# Patient Record
Sex: Female | Born: 1990 | Race: Black or African American | Hispanic: No | Smoking: Former smoker
Health system: Southern US, Community
[De-identification: ages and names within clinical notes are randomized; demographics above are authoritative.]

## PROBLEM LIST (undated history)

## (undated) DIAGNOSIS — Z789 Other specified health status: Secondary | ICD-10-CM

## (undated) DIAGNOSIS — F419 Anxiety disorder, unspecified: Secondary | ICD-10-CM

## (undated) DIAGNOSIS — F29 Unspecified psychosis not due to a substance or known physiological condition: Secondary | ICD-10-CM

## (undated) HISTORY — PX: KNEE ARTHROSCOPY W/ ACL RECONSTRUCTION: SHX1858

---
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
## 2013-10-23 ENCOUNTER — Ambulatory Visit (INDEPENDENT_AMBULATORY_CARE_PROVIDER_SITE_OTHER): Payer: BC Managed Care – PPO | Admitting: Adult Health

## 2013-10-23 ENCOUNTER — Encounter: Payer: Self-pay | Admitting: Adult Health

## 2013-10-23 VITALS — BP 102/50 | Ht 73.0 in | Wt 237.2 lb

## 2013-10-23 DIAGNOSIS — Z3201 Encounter for pregnancy test, result positive: Secondary | ICD-10-CM

## 2013-10-23 DIAGNOSIS — Z3490 Encounter for supervision of normal pregnancy, unspecified, unspecified trimester: Secondary | ICD-10-CM

## 2013-10-23 LAB — POCT URINE PREGNANCY: Preg Test, Ur: POSITIVE

## 2013-10-23 MED ORDER — PRENATAL PLUS 27-1 MG PO TABS: 1.0000 | ORAL_TABLET | Freq: Every day | ORAL | Status: DC

## 2013-10-23 NOTE — Patient Instructions (Signed)
First Trimester of Pregnancy The first trimester of pregnancy is from week 1 until the end of week 12 (months 1 through 3). A week after a sperm fertilizes an egg, the egg will implant on the wall of the uterus. This embryo will begin to develop into a baby. Genes from you and your partner are forming the baby. The female genes determine whether the baby is a boy or a girl. At 6-8 weeks, the eyes and face are formed, and the heartbeat can be seen on ultrasound. At the end of 12 weeks, all the baby's organs are formed.  Now that you are pregnant, you will want to do everything you can to have a healthy baby. Two of the most important things are to get good prenatal care and to follow your health care provider's instructions. Prenatal care is all the medical care you receive before the baby's birth. This care will help prevent, find, and treat any problems during the pregnancy and childbirth. BODY CHANGES Your body goes through many changes during pregnancy. The changes vary from woman to woman.   You may gain or lose a couple of pounds at first.  You may feel sick to your stomach (nauseous) and throw up (vomit). If the vomiting is uncontrollable, call your health care provider.  You may tire easily.  You may develop headaches that can be relieved by medicines approved by your health care provider.  You may urinate more often. Painful urination may mean you have a bladder infection.  You may develop heartburn as a result of your pregnancy.  You may develop constipation because certain hormones are causing the muscles that push waste through your intestines to slow down.  You may develop hemorrhoids or swollen, bulging veins (varicose veins).  Your breasts may begin to grow larger and become tender. Your nipples may stick out more, and the tissue that surrounds them (areola) may become darker.  Your gums may bleed and may be sensitive to brushing and flossing.  Dark spots or blotches (chloasma,  mask of pregnancy) may develop on your face. This will likely fade after the baby is born.  Your menstrual periods will stop.  You may have a loss of appetite.  You may develop cravings for certain kinds of food.  You may have changes in your emotions from day to day, such as being excited to be pregnant or being concerned that something may go wrong with the pregnancy and baby.  You may have more vivid and strange dreams.  You may have changes in your hair. These can include thickening of your hair, rapid growth, and changes in texture. Some women also have hair loss during or after pregnancy, or hair that feels dry or thin. Your hair will most likely return to normal after your baby is born. WHAT TO EXPECT AT YOUR PRENATAL VISITS During a routine prenatal visit:  You will be weighed to make sure you and the baby are growing normally.  Your blood pressure will be taken.  Your abdomen will be measured to track your baby's growth.  The fetal heartbeat will be listened to starting around week 10 or 12 of your pregnancy.  Test results from any previous visits will be discussed. Your health care provider may ask you:  How you are feeling.  If you are feeling the baby move.  If you have had any abnormal symptoms, such as leaking fluid, bleeding, severe headaches, or abdominal cramping.  If you have any questions. Other tests   that may be performed during your first trimester include:  Blood tests to find your blood type and to check for the presence of any previous infections. They will also be used to check for low iron levels (anemia) and Rh antibodies. Later in the pregnancy, blood tests for diabetes will be done along with other tests if problems develop.  Urine tests to check for infections, diabetes, or protein in the urine.  An ultrasound to confirm the proper growth and development of the baby.  An amniocentesis to check for possible genetic problems.  Fetal screens for  spina bifida and Down syndrome.  You may need other tests to make sure you and the baby are doing well. HOME CARE INSTRUCTIONS  Medicines  Follow your health care provider's instructions regarding medicine use. Specific medicines may be either safe or unsafe to take during pregnancy.  Take your prenatal vitamins as directed.  If you develop constipation, try taking a stool softener if your health care provider approves. Diet  Eat regular, well-balanced meals. Choose a variety of foods, such as meat or vegetable-based protein, fish, milk and low-fat dairy products, vegetables, fruits, and whole grain breads and cereals. Your health care provider will help you determine the amount of weight gain that is right for you.  Avoid raw meat and uncooked cheese. These carry germs that can cause birth defects in the baby.  Eating four or five small meals rather than three large meals a day may help relieve nausea and vomiting. If you start to feel nauseous, eating a few soda crackers can be helpful. Drinking liquids between meals instead of during meals also seems to help nausea and vomiting.  If you develop constipation, eat more high-fiber foods, such as fresh vegetables or fruit and whole grains. Drink enough fluids to keep your urine clear or pale yellow. Activity and Exercise  Exercise only as directed by your health care provider. Exercising will help you:  Control your weight.  Stay in shape.  Be prepared for labor and delivery.  Experiencing pain or cramping in the lower abdomen or low back is a good sign that you should stop exercising. Check with your health care provider before continuing normal exercises.  Try to avoid standing for long periods of time. Move your legs often if you must stand in one place for a long time.  Avoid heavy lifting.  Wear low-heeled shoes, and practice good posture.  You may continue to have sex unless your health care provider directs you  otherwise. Relief of Pain or Discomfort  Wear a good support bra for breast tenderness.   Take warm sitz baths to soothe any pain or discomfort caused by hemorrhoids. Use hemorrhoid cream if your health care provider approves.   Rest with your legs elevated if you have leg cramps or low back pain.  If you develop varicose veins in your legs, wear support hose. Elevate your feet for 15 minutes, 3-4 times a day. Limit salt in your diet. Prenatal Care  Schedule your prenatal visits by the twelfth week of pregnancy. They are usually scheduled monthly at first, then more often in the last 2 months before delivery.  Write down your questions. Take them to your prenatal visits.  Keep all your prenatal visits as directed by your health care provider. Safety  Wear your seat belt at all times when driving.  Make a list of emergency phone numbers, including numbers for family, friends, the hospital, and police and fire departments. General Tips    Ask your health care provider for a referral to a local prenatal education class. Begin classes no later than at the beginning of month 6 of your pregnancy.  Ask for help if you have counseling or nutritional needs during pregnancy. Your health care provider can offer advice or refer you to specialists for help with various needs.  Do not use hot tubs, steam rooms, or saunas.  Do not douche or use tampons or scented sanitary pads.  Do not cross your legs for long periods of time.  Avoid cat litter boxes and soil used by cats. These carry germs that can cause birth defects in the baby and possibly loss of the fetus by miscarriage or stillbirth.  Avoid all smoking, herbs, alcohol, and medicines not prescribed by your health care provider. Chemicals in these affect the formation and growth of the baby.  Schedule a dentist appointment. At home, brush your teeth with a soft toothbrush and be gentle when you floss. SEEK MEDICAL CARE IF:   You have  dizziness.  You have mild pelvic cramps, pelvic pressure, or nagging pain in the abdominal area.  You have persistent nausea, vomiting, or diarrhea.  You have a bad smelling vaginal discharge.  You have pain with urination.  You notice increased swelling in your face, hands, legs, or ankles. SEEK IMMEDIATE MEDICAL CARE IF:   You have a fever.  You are leaking fluid from your vagina.  You have spotting or bleeding from your vagina.  You have severe abdominal cramping or pain.  You have rapid weight gain or loss.  You vomit blood or material that looks like coffee grounds.  You are exposed to German measles and have never had them.  You are exposed to fifth disease or chickenpox.  You develop a severe headache.  You have shortness of breath.  You have any kind of trauma, such as from a fall or a car accident. Document Released: 12/19/2000 Document Revised: 05/11/2013 Document Reviewed: 11/04/2012 [employer] Patient Information 2015 [employer]. This information is not intended to replace advice given to you by your health care provider. Make sure you discuss any questions you have with your health care provider. Return in 1 week for US 

## 2013-10-23 NOTE — Progress Notes (Signed)
Subjective:     Patient ID: Laurie Hooper, female   DOB: 12/25/1990, 23 y.o.   MRN: [id number]  HPI Laurie Hooper is a 23 year old black female in for UPT.  Review of Systems    see HPI Reviewed past medical,surgical, social and family history. Reviewed medications and allergies.  Objective:   Physical Exam BP 102/50  Ht 6\' 1"  (1.854 m)  Wt 237 lb 3.2 oz (107.593 kg)  BMI 31.30 kg/m2  LMP 08/13/2015UPT+ about 9+1 week EDD 05/28/14.Has some constipation increase fruit and water,No nausea.    Assessment:     Pregnant     Plan:     Rx prenatal plus #30 1 daily with 11 refills Follow up in 1 week for dating US Form for medicaid given application   Review handout on first tirmester

## 2013-10-30 ENCOUNTER — Encounter: Payer: Self-pay | Admitting: Adult Health

## 2013-10-30 ENCOUNTER — Other Ambulatory Visit: Payer: Self-pay | Admitting: Adult Health

## 2013-10-30 ENCOUNTER — Ambulatory Visit (INDEPENDENT_AMBULATORY_CARE_PROVIDER_SITE_OTHER): Payer: BC Managed Care – PPO

## 2013-10-30 DIAGNOSIS — O26841 Uterine size-date discrepancy, first trimester: Secondary | ICD-10-CM

## 2013-10-30 DIAGNOSIS — Z3490 Encounter for supervision of normal pregnancy, unspecified, unspecified trimester: Secondary | ICD-10-CM

## 2013-10-30 NOTE — Progress Notes (Signed)
U/S-single IUP with +FCA noted, FHR-179 bom, CRL c/w 8+4wks EDD 06/07/2014, cx appears closed, retroverted uterus, bilateral adnexa appears WNL

## 2013-11-04 ENCOUNTER — Encounter: Payer: Self-pay | Admitting: Advanced Practice Midwife

## 2013-11-04 ENCOUNTER — Ambulatory Visit (INDEPENDENT_AMBULATORY_CARE_PROVIDER_SITE_OTHER): Payer: BC Managed Care – PPO | Admitting: Advanced Practice Midwife

## 2013-11-04 ENCOUNTER — Other Ambulatory Visit (HOSPITAL_COMMUNITY)
Admission: RE | Admit: 2013-11-04 | Discharge: 2013-11-04 | Disposition: A | Discharge status: Home / Self Care | Payer: BC Managed Care – PPO | Source: Ambulatory Visit | Attending: Advanced Practice Midwife | Admitting: Advanced Practice Midwife

## 2013-11-04 VITALS — BP 108/64 | Wt 243.0 lb

## 2013-11-04 DIAGNOSIS — Z114 Encounter for screening for human immunodeficiency virus [HIV]: Secondary | ICD-10-CM

## 2013-11-04 DIAGNOSIS — Z3401 Encounter for supervision of normal first pregnancy, first trimester: Secondary | ICD-10-CM

## 2013-11-04 DIAGNOSIS — Z3490 Encounter for supervision of normal pregnancy, unspecified, unspecified trimester: Secondary | ICD-10-CM | POA: Insufficient documentation

## 2013-11-04 DIAGNOSIS — Z01411 Encounter for gynecological examination (general) (routine) with abnormal findings: Secondary | ICD-10-CM | POA: Insufficient documentation

## 2013-11-04 DIAGNOSIS — Z331 Pregnant state, incidental: Secondary | ICD-10-CM

## 2013-11-04 DIAGNOSIS — Z1371 Encounter for nonprocreative screening for genetic disease carrier status: Secondary | ICD-10-CM

## 2013-11-04 DIAGNOSIS — Z130 Encounter for screening for diseases of the blood and blood-forming organs and certain disorders involving the immune mechanism: Secondary | ICD-10-CM

## 2013-11-04 DIAGNOSIS — Z124 Encounter for screening for malignant neoplasm of cervix: Secondary | ICD-10-CM

## 2013-11-04 DIAGNOSIS — Z1389 Encounter for screening for other disorder: Secondary | ICD-10-CM

## 2013-11-04 DIAGNOSIS — Z3491 Encounter for supervision of normal pregnancy, unspecified, first trimester: Secondary | ICD-10-CM

## 2013-11-04 DIAGNOSIS — Z0184 Encounter for antibody response examination: Secondary | ICD-10-CM

## 2013-11-04 DIAGNOSIS — Z113 Encounter for screening for infections with a predominantly sexual mode of transmission: Secondary | ICD-10-CM | POA: Diagnosis present

## 2013-11-04 DIAGNOSIS — Z1151 Encounter for screening for human papillomavirus (HPV): Secondary | ICD-10-CM | POA: Diagnosis present

## 2013-11-04 DIAGNOSIS — R87810 Cervical high risk human papillomavirus (HPV) DNA test positive: Secondary | ICD-10-CM | POA: Insufficient documentation

## 2013-11-04 DIAGNOSIS — Z0283 Encounter for blood-alcohol and blood-drug test: Secondary | ICD-10-CM

## 2013-11-04 DIAGNOSIS — Z029 Encounter for administrative examinations, unspecified: Secondary | ICD-10-CM

## 2013-11-04 NOTE — Patient Instructions (Signed)
First Trimester of Pregnancy The first trimester of pregnancy is from week 1 until the end of week 12 (months 1 through 3). A week after a sperm fertilizes an egg, the egg will implant on the wall of the uterus. This embryo will begin to develop into a baby. Genes from you and your partner are forming the baby. The female genes determine whether the baby is a boy or a girl. At 6-8 weeks, the eyes and face are formed, and the heartbeat can be seen on ultrasound. At the end of 12 weeks, all the baby's organs are formed.  Now that you are pregnant, you will want to do everything you can to have a healthy baby. Two of the most important things are to get good prenatal care and to follow your health care provider's instructions. Prenatal care is all the medical care you receive before the baby's birth. This care will help prevent, find, and treat any problems during the pregnancy and childbirth. BODY CHANGES Your body goes through many changes during pregnancy. The changes vary from woman to woman.   You may gain or lose a couple of pounds at first.  You may feel sick to your stomach (nauseous) and throw up (vomit). If the vomiting is uncontrollable, call your health care provider.  You may tire easily.  You may develop headaches that can be relieved by medicines approved by your health care provider.  You may urinate more often. Painful urination may mean you have a bladder infection.  You may develop heartburn as a result of your pregnancy.  You may develop constipation because certain hormones are causing the muscles that push waste through your intestines to slow down.  You may develop hemorrhoids or swollen, bulging veins (varicose veins).  Your breasts may begin to grow larger and become tender. Your nipples may stick out more, and the tissue that surrounds them (areola) may become darker.  Your gums may bleed and may be sensitive to brushing and flossing.  Dark spots or blotches (chloasma,  mask of pregnancy) may develop on your face. This will likely fade after the baby is born.  Your menstrual periods will stop.  You may have a loss of appetite.  You may develop cravings for certain kinds of food.  You may have changes in your emotions from day to day, such as being excited to be pregnant or being concerned that something may go wrong with the pregnancy and baby.  You may have more vivid and strange dreams.  You may have changes in your hair. These can include thickening of your hair, rapid growth, and changes in texture. Some women also have hair loss during or after pregnancy, or hair that feels dry or thin. Your hair will most likely return to normal after your baby is born. WHAT TO EXPECT AT YOUR PRENATAL VISITS During a routine prenatal visit:  You will be weighed to make sure you and the baby are growing normally.  Your blood pressure will be taken.  Your abdomen will be measured to track your baby's growth.  The fetal heartbeat will be listened to starting around week 10 or 12 of your pregnancy.  Test results from any previous visits will be discussed. Your health care provider may ask you:  How you are feeling.  If you are feeling the baby move.  If you have had any abnormal symptoms, such as leaking fluid, bleeding, severe headaches, or abdominal cramping.  If you have any questions. Other tests   that may be performed during your first trimester include:  Blood tests to find your blood type and to check for the presence of any previous infections. They will also be used to check for low iron levels (anemia) and Rh antibodies. Later in the pregnancy, blood tests for diabetes will be done along with other tests if problems develop.  Urine tests to check for infections, diabetes, or protein in the urine.  An ultrasound to confirm the proper growth and development of the baby.  An amniocentesis to check for possible genetic problems.  Fetal screens for  spina bifida and Down syndrome.  You may need other tests to make sure you and the baby are doing well. HOME CARE INSTRUCTIONS  Medicines  Follow your health care provider's instructions regarding medicine use. Specific medicines may be either safe or unsafe to take during pregnancy.  Take your prenatal vitamins as directed.  If you develop constipation, try taking a stool softener if your health care provider approves. Diet  Eat regular, well-balanced meals. Choose a variety of foods, such as meat or vegetable-based protein, fish, milk and low-fat dairy products, vegetables, fruits, and whole grain breads and cereals. Your health care provider will help you determine the amount of weight gain that is right for you.  Avoid raw meat and uncooked cheese. These carry germs that can cause birth defects in the baby.  Eating four or five small meals rather than three large meals a day may help relieve nausea and vomiting. If you start to feel nauseous, eating a few soda crackers can be helpful. Drinking liquids between meals instead of during meals also seems to help nausea and vomiting.  If you develop constipation, eat more high-fiber foods, such as fresh vegetables or fruit and whole grains. Drink enough fluids to keep your urine clear or pale yellow. Activity and Exercise  Exercise only as directed by your health care provider. Exercising will help you:  Control your weight.  Stay in shape.  Be prepared for labor and delivery.  Experiencing pain or cramping in the lower abdomen or low back is a good sign that you should stop exercising. Check with your health care provider before continuing normal exercises.  Try to avoid standing for long periods of time. Move your legs often if you must stand in one place for a long time.  Avoid heavy lifting.  Wear low-heeled shoes, and practice good posture.  You may continue to have sex unless your health care provider directs you  otherwise. Relief of Pain or Discomfort  Wear a good support bra for breast tenderness.   Take warm sitz baths to soothe any pain or discomfort caused by hemorrhoids. Use hemorrhoid cream if your health care provider approves.   Rest with your legs elevated if you have leg cramps or low back pain.  If you develop varicose veins in your legs, wear support hose. Elevate your feet for 15 minutes, 3-4 times a day. Limit salt in your diet. Prenatal Care  Schedule your prenatal visits by the twelfth week of pregnancy. They are usually scheduled monthly at first, then more often in the last 2 months before delivery.  Write down your questions. Take them to your prenatal visits.  Keep all your prenatal visits as directed by your health care provider. Safety  Wear your seat belt at all times when driving.  Make a list of emergency phone numbers, including numbers for family, friends, the hospital, and police and fire departments. General Tips    Ask your health care provider for a referral to a local prenatal education class. Begin classes no later than at the beginning of month 6 of your pregnancy.  Ask for help if you have counseling or nutritional needs during pregnancy. Your health care provider can offer advice or refer you to specialists for help with various needs.  Do not use hot tubs, steam rooms, or saunas.  Do not douche or use tampons or scented sanitary pads.  Do not cross your legs for long periods of time.  Avoid cat litter boxes and soil used by cats. These carry germs that can cause birth defects in the baby and possibly loss of the fetus by miscarriage or stillbirth.  Avoid all smoking, herbs, alcohol, and medicines not prescribed by your health care provider. Chemicals in these affect the formation and growth of the baby.  Schedule a dentist appointment. At home, brush your teeth with a soft toothbrush and be gentle when you floss. SEEK MEDICAL CARE IF:   You have  dizziness.  You have mild pelvic cramps, pelvic pressure, or nagging pain in the abdominal area.  You have persistent nausea, vomiting, or diarrhea.  You have a bad smelling vaginal discharge.  You have pain with urination.  You notice increased swelling in your face, hands, legs, or ankles. SEEK IMMEDIATE MEDICAL CARE IF:   You have a fever.  You are leaking fluid from your vagina.  You have spotting or bleeding from your vagina.  You have severe abdominal cramping or pain.  You have rapid weight gain or loss.  You vomit blood or material that looks like coffee grounds.  You are exposed to German measles and have never had them.  You are exposed to fifth disease or chickenpox.  You develop a severe headache.  You have shortness of breath.  You have any kind of trauma, such as from a fall or a car accident. Document Released: 12/19/2000 Document Revised: 05/11/2013 Document Reviewed: 11/04/2012 [employer] Patient Information 2015 [employer]. This information is not intended to replace advice given to you by your health care provider. Make sure you discuss any questions you have with your health care provider.  

## 2013-11-04 NOTE — Progress Notes (Signed)
  Subjective:    Laurie Hooper is a G1P0 6881w2d being seen today for her first obstetrical visit.  Her obstetrical history is significant for nothing..  Pregnancy history fully reviewed.  Patient reports fatigue.  There were no vitals filed for this visit.  HISTORY: OB History  Gravida Para Term Preterm AB SAB TAB Ectopic Multiple Living  1             # Outcome Date GA Lbr Len/2nd Weight Sex Delivery Anes PTL Lv  1 CUR              No past medical history on file. Past Surgical History  Procedure Laterality Date  . Acl restoration     Family History  Problem Relation Age of Onset  . Diabetes Paternal Grandmother   . Kidney disease Father   . Thyroid disease Mother      Exam       Pelvic Exam:    Perineum: Normal Perineum   Vulva: normal   Vagina:  normal mucosa, normal discharge, no palpable nodules   Uterus Normal, Gravid, FH: ~9     Cervix: normal   Adnexa: Not palpable   Urinary:  urethral meatus normal    System: Breast:  normal appearance, no masses or tenderness   Skin: normal coloration and turgor, no rashes    Neurologic: oriented, normal, normal mood   Extremities: normal strength, tone, and muscle mass   HEENT PERRLA   Mouth/Teeth mucous membranes moist, normal dentition   Neck supple and no masses   Cardiovascular: regular rate and rhythm   Respiratory:  appears well, vitals normal, no respiratory distress, acyanotic   Abdomen: soft, non-tender;  FHR: 160 us          Assessment:    Pregnancy: G1P0 Patient Active Problem List   Diagnosis Date Noted  . Supervision of normal pregnancy 11/04/2013  . Pregnant 10/23/2013        Plan:     Initial labs drawn. Continue prenatal vitamins  Problem list reviewed and updated  Reviewed n/v relief measures and warning s/s to report  Reviewed recommended weight gain based on pre-gravid BMI  Encouraged well-balanced diet Genetic Screening discussed Integrated Screen: requested.  Ultrasound discussed; fetal survey: requested.  Follow up in 3 weeks for NT/IT.  CRESENZO-DISHMAN,Karolynn Hooper 11/04/2013

## 2013-11-05 LAB — HIV ANTIBODY (ROUTINE TESTING W REFLEX): HIV: NONREACTIVE

## 2013-11-05 LAB — URINALYSIS, ROUTINE W REFLEX MICROSCOPIC
BILIRUBIN URINE: NEGATIVE
Glucose, UA: NEGATIVE mg/dL
Hgb urine dipstick: NEGATIVE
Ketones, ur: NEGATIVE mg/dL
NITRITE: POSITIVE — AB
PROTEIN: NEGATIVE mg/dL
Specific Gravity, Urine: >1.03 — ABNORMAL HIGH (ref 1.005–1.030)
UROBILINOGEN UA: 0.2 mg/dL (ref 0.0–1.0)
pH: 6 (ref 5.0–8.0)

## 2013-11-05 LAB — CBC
HCT: 37.7 % (ref 36.0–46.0)
Hemoglobin: 12.7 g/dL (ref 12.0–15.0)
MCH: 28.3 pg (ref 26.0–34.0)
MCHC: 33.7 g/dL (ref 30.0–36.0)
MCV: 84.2 fL (ref 78.0–100.0)
PLATELETS: 224 10*3/uL (ref 150–400)
RBC: 4.48 MIL/uL (ref 3.87–5.11)
RDW: 13.4 % (ref 11.5–15.5)
WBC: 9.4 10*3/uL (ref 4.0–10.5)

## 2013-11-05 LAB — URINALYSIS, MICROSCOPIC ONLY
Bacteria, UA: NONE SEEN
CASTS: NONE SEEN
Crystals: NONE SEEN
Squamous Epithelial / LPF: NONE SEEN

## 2013-11-05 LAB — ABO AND RH: RH TYPE: POSITIVE

## 2013-11-05 LAB — DRUG SCREEN, URINE, NO CONFIRMATION
Amphetamine Screen, Ur: NEGATIVE
Barbiturate Quant, Ur: NEGATIVE
Benzodiazepines.: NEGATIVE
CREATININE, U: 289.7 mg/dL
Cocaine Metabolites: NEGATIVE
Marijuana Metabolite: NEGATIVE
Methadone: NEGATIVE
Opiate Screen, Urine: NEGATIVE
Phencyclidine (PCP): NEGATIVE
Propoxyphene: NEGATIVE

## 2013-11-05 LAB — OXYCODONE SCREEN, UA, RFLX CONFIRM: Oxycodone Screen, Ur: NEGATIVE ng/mL

## 2013-11-05 LAB — VARICELLA ZOSTER ANTIBODY, IGG: VARICELLA IGG: 183.2 {index} — AB (ref <135.00)

## 2013-11-05 LAB — CYSTIC FIBROSIS DIAGNOSTIC STUDY

## 2013-11-05 LAB — SICKLE CELL SCREEN: Sickle Cell Screen: NEGATIVE

## 2013-11-05 LAB — RUBELLA SCREEN: Rubella: 1.42 Index — ABNORMAL HIGH (ref <0.90)

## 2013-11-05 LAB — RPR

## 2013-11-05 LAB — HEPATITIS B SURFACE ANTIGEN: Hepatitis B Surface Ag: NEGATIVE

## 2013-11-05 LAB — ANTIBODY SCREEN: ANTIBODY SCREEN: NEGATIVE

## 2013-11-05 MED ORDER — NITROFURANTOIN MONOHYD MACRO 100 MG PO CAPS: 100.0000 mg | ORAL_CAPSULE | Freq: Two times a day (BID) | ORAL | Status: AC

## 2013-11-05 NOTE — Addendum Note (Signed)
Addended by: Jacklyn ShellRESENZO-DISHMON, Delano Scardino on: 11/05/2013 01:33 PM   Modules accepted: Orders

## 2013-11-05 NOTE — Progress Notes (Signed)
U/A + Nitrites.  Rx macrobid 100mg  BID

## 2013-11-06 LAB — URINE CULTURE
Colony Count: NO GROWTH
Organism ID, Bacteria: NO GROWTH

## 2013-11-06 LAB — CYTOLOGY - PAP

## 2013-11-09 ENCOUNTER — Encounter: Payer: Self-pay | Admitting: Advanced Practice Midwife

## 2013-11-15 ENCOUNTER — Encounter: Payer: Self-pay | Admitting: Women's Health

## 2013-11-25 ENCOUNTER — Ambulatory Visit (INDEPENDENT_AMBULATORY_CARE_PROVIDER_SITE_OTHER): Payer: BC Managed Care – PPO

## 2013-11-25 ENCOUNTER — Ambulatory Visit (INDEPENDENT_AMBULATORY_CARE_PROVIDER_SITE_OTHER): Payer: BC Managed Care – PPO | Admitting: Women's Health

## 2013-11-25 ENCOUNTER — Encounter: Payer: Self-pay | Admitting: Women's Health

## 2013-11-25 ENCOUNTER — Other Ambulatory Visit: Payer: Self-pay | Admitting: Advanced Practice Midwife

## 2013-11-25 VITALS — BP 116/68 | Wt 241.0 lb

## 2013-11-25 DIAGNOSIS — Z3491 Encounter for supervision of normal pregnancy, unspecified, first trimester: Secondary | ICD-10-CM

## 2013-11-25 DIAGNOSIS — Z3401 Encounter for supervision of normal first pregnancy, first trimester: Secondary | ICD-10-CM

## 2013-11-25 DIAGNOSIS — Z331 Pregnant state, incidental: Secondary | ICD-10-CM

## 2013-11-25 DIAGNOSIS — O98811 Other maternal infectious and parasitic diseases complicating pregnancy, first trimester: Secondary | ICD-10-CM

## 2013-11-25 DIAGNOSIS — Z1389 Encounter for screening for other disorder: Secondary | ICD-10-CM

## 2013-11-25 DIAGNOSIS — Z3682 Encounter for antenatal screening for nuchal translucency: Secondary | ICD-10-CM

## 2013-11-25 DIAGNOSIS — A749 Chlamydial infection, unspecified: Secondary | ICD-10-CM

## 2013-11-25 DIAGNOSIS — Z36 Encounter for antenatal screening of mother: Secondary | ICD-10-CM

## 2013-11-25 LAB — POCT URINALYSIS DIPSTICK
Glucose, UA: NEGATIVE
Ketones, UA: NEGATIVE
Leukocytes, UA: NEGATIVE
NITRITE UA: NEGATIVE
RBC UA: NEGATIVE

## 2013-11-25 MED ORDER — AZITHROMYCIN 500 MG PO TABS: 1000.0000 mg | ORAL_TABLET | Freq: Once | ORAL | Status: DC

## 2013-11-25 NOTE — Progress Notes (Signed)
Low-risk OB appointment G1P0 657w2d Estimated Date of Delivery: 06/07/14 BP 116/68 mmHg  Wt 241 lb (109.317 kg)  LMP 08/20/2013  BP, weight, and urine reviewed.  Refer to obstetrical flow sheet for FH & FHR.  No fm yet. Denies cramping, lof, vb, or uti s/s. No complaints. Notified of +CT on pap, rx azithromycin 1gm po x 1 for pt & partner Barkley BoardsJohncie Hooper DOB: 03/06/[birth year], NKDA- called his into [facility]. No sex x at least 7d after both treated.  Reviewed today's normal NT u/s, warning s/s to report. Plan:  Continue routine obstetrical care  F/U in 4wks for OB appointment and 2nd IT 1st NT/IT today

## 2013-11-25 NOTE — Progress Notes (Signed)
U/S(12+2wks)-single active fetus, CRL c/w dates, cx appears closed, bilateral adnexa appears WNL, NB present, NT-1.637mm, FHR-161 bpm, anterior Gr 0 placenta with multiple placental lakes noted within

## 2013-11-25 NOTE — Patient Instructions (Signed)
First Trimester of Pregnancy The first trimester of pregnancy is from week 1 until the end of week 12 (months 1 through 3). A week after a sperm fertilizes an egg, the egg will implant on the wall of the uterus. This embryo will begin to develop into a baby. Genes from you and your partner are forming the baby. The female genes determine whether the baby is a boy or a girl. At 6-8 weeks, the eyes and face are formed, and the heartbeat can be seen on ultrasound. At the end of 12 weeks, all the baby's organs are formed.  Now that you are pregnant, you will want to do everything you can to have a healthy baby. Two of the most important things are to get good prenatal care and to follow your health care provider's instructions. Prenatal care is all the medical care you receive before the baby's birth. This care will help prevent, find, and treat any problems during the pregnancy and childbirth. BODY CHANGES Your body goes through many changes during pregnancy. The changes vary from woman to woman.   You may gain or lose a couple of pounds at first.  You may feel sick to your stomach (nauseous) and throw up (vomit). If the vomiting is uncontrollable, call your health care provider.  You may tire easily.  You may develop headaches that can be relieved by medicines approved by your health care provider.  You may urinate more often. Painful urination may mean you have a bladder infection.  You may develop heartburn as a result of your pregnancy.  You may develop constipation because certain hormones are causing the muscles that push waste through your intestines to slow down.  You may develop hemorrhoids or swollen, bulging veins (varicose veins).  Your breasts may begin to grow larger and become tender. Your nipples may stick out more, and the tissue that surrounds them (areola) may become darker.  Your gums may bleed and may be sensitive to brushing and flossing.  Dark spots or blotches (chloasma,  mask of pregnancy) may develop on your face. This will likely fade after the baby is born.  Your menstrual periods will stop.  You may have a loss of appetite.  You may develop cravings for certain kinds of food.  You may have changes in your emotions from day to day, such as being excited to be pregnant or being concerned that something may go wrong with the pregnancy and baby.  You may have more vivid and strange dreams.  You may have changes in your hair. These can include thickening of your hair, rapid growth, and changes in texture. Some women also have hair loss during or after pregnancy, or hair that feels dry or thin. Your hair will most likely return to normal after your baby is born. WHAT TO EXPECT AT YOUR PRENATAL VISITS During a routine prenatal visit:  You will be weighed to make sure you and the baby are growing normally.  Your blood pressure will be taken.  Your abdomen will be measured to track your baby's growth.  The fetal heartbeat will be listened to starting around week 10 or 12 of your pregnancy.  Test results from any previous visits will be discussed. Your health care provider may ask you:  How you are feeling.  If you are feeling the baby move.  If you have had any abnormal symptoms, such as leaking fluid, bleeding, severe headaches, or abdominal cramping.  If you have any questions. Other tests   that may be performed during your first trimester include:  Blood tests to find your blood type and to check for the presence of any previous infections. They will also be used to check for low iron levels (anemia) and Rh antibodies. Later in the pregnancy, blood tests for diabetes will be done along with other tests if problems develop.  Urine tests to check for infections, diabetes, or protein in the urine.  An ultrasound to confirm the proper growth and development of the baby.  An amniocentesis to check for possible genetic problems.  Fetal screens for  spina bifida and Down syndrome.  You may need other tests to make sure you and the baby are doing well. HOME CARE INSTRUCTIONS  Medicines  Follow your health care provider's instructions regarding medicine use. Specific medicines may be either safe or unsafe to take during pregnancy.  Take your prenatal vitamins as directed.  If you develop constipation, try taking a stool softener if your health care provider approves. Diet  Eat regular, well-balanced meals. Choose a variety of foods, such as meat or vegetable-based protein, fish, milk and low-fat dairy products, vegetables, fruits, and whole grain breads and cereals. Your health care provider will help you determine the amount of weight gain that is right for you.  Avoid raw meat and uncooked cheese. These carry germs that can cause birth defects in the baby.  Eating four or five small meals rather than three large meals a day may help relieve nausea and vomiting. If you start to feel nauseous, eating a few soda crackers can be helpful. Drinking liquids between meals instead of during meals also seems to help nausea and vomiting.  If you develop constipation, eat more high-fiber foods, such as fresh vegetables or fruit and whole grains. Drink enough fluids to keep your urine clear or pale yellow. Activity and Exercise  Exercise only as directed by your health care provider. Exercising will help you:  Control your weight.  Stay in shape.  Be prepared for labor and delivery.  Experiencing pain or cramping in the lower abdomen or low back is a good sign that you should stop exercising. Check with your health care provider before continuing normal exercises.  Try to avoid standing for long periods of time. Move your legs often if you must stand in one place for a long time.  Avoid heavy lifting.  Wear low-heeled shoes, and practice good posture.  You may continue to have sex unless your health care provider directs you  otherwise. Relief of Pain or Discomfort  Wear a good support bra for breast tenderness.   Take warm sitz baths to soothe any pain or discomfort caused by hemorrhoids. Use hemorrhoid cream if your health care provider approves.   Rest with your legs elevated if you have leg cramps or low back pain.  If you develop varicose veins in your legs, wear support hose. Elevate your feet for 15 minutes, 3-4 times a day. Limit salt in your diet. Prenatal Care  Schedule your prenatal visits by the twelfth week of pregnancy. They are usually scheduled monthly at first, then more often in the last 2 months before delivery.  Write down your questions. Take them to your prenatal visits.  Keep all your prenatal visits as directed by your health care provider. Safety  Wear your seat belt at all times when driving.  Make a list of emergency phone numbers, including numbers for family, friends, the hospital, and police and fire departments. General Tips    Ask your health care provider for a referral to a local prenatal education class. Begin classes no later than at the beginning of month 6 of your pregnancy.  Ask for help if you have counseling or nutritional needs during pregnancy. Your health care provider can offer advice or refer you to specialists for help with various needs.  Do not use hot tubs, steam rooms, or saunas.  Do not douche or use tampons or scented sanitary pads.  Do not cross your legs for long periods of time.  Avoid cat litter boxes and soil used by cats. These carry germs that can cause birth defects in the baby and possibly loss of the fetus by miscarriage or stillbirth.  Avoid all smoking, herbs, alcohol, and medicines not prescribed by your health care provider. Chemicals in these affect the formation and growth of the baby.  Schedule a dentist appointment. At home, brush your teeth with a soft toothbrush and be gentle when you floss. SEEK MEDICAL CARE IF:   You have  dizziness.  You have mild pelvic cramps, pelvic pressure, or nagging pain in the abdominal area.  You have persistent nausea, vomiting, or diarrhea.  You have a bad smelling vaginal discharge.  You have pain with urination.  You notice increased swelling in your face, hands, legs, or ankles. SEEK IMMEDIATE MEDICAL CARE IF:   You have a fever.  You are leaking fluid from your vagina.  You have spotting or bleeding from your vagina.  You have severe abdominal cramping or pain.  You have rapid weight gain or loss.  You vomit blood or material that looks like coffee grounds.  You are exposed to [place] and have never had them.  You are exposed to fifth disease or chickenpox.  You develop a severe headache.  You have shortness of breath.  You have any kind of trauma, such as from a fall or a car accident. Document Released: 12/19/2000 Document Revised: 05/11/2013 Document Reviewed: 11/04/2012 [facility] Patient Information 2015 [place], [place]. This information is not intended to replace advice given to you by your health care provider. Make sure you discuss any questions you have with your health care provider.  Chlamydia Chlamydia is an infection. It is spread through sexual contact. Chlamydia can be in different areas of the body. These areas include the cervix, urethra, throat, or rectum. You may not know you have chlamydia because many people never develop the symptoms. Chlamydia is not difficult to treat once you know you have it. However, if it is left untreated, chlamydia can lead to more serious health problems.  CAUSES  Chlamydia is caused by bacteria. It is a sexually transmitted disease. It is passed from an infected partner during intimate contact. This contact could be with the genitals, mouth, or rectal area. Chlamydia can also be passed from mothers to babies during birth. SIGNS AND SYMPTOMS  There may not be any symptoms. This is often the case early in the  infection. If symptoms develop, they may include:  Mild pain and discomfort when urinating.  Redness, soreness, and swelling (inflammation) of the rectum.  Vaginal discharge.  Painful intercourse.  Abdominal pain.  Bleeding between menstrual periods. DIAGNOSIS  To diagnose this infection, your health care provider will do a pelvic exam. Cultures will be taken of the vagina, cervix, urine, and possibly the rectum to verify the diagnosis.  TREATMENT You will be given antibiotic medicines. If you are pregnant, certain types of antibiotics will need to be avoided. Any  sexual partners should also be treated, even if they do not show symptoms.  HOME CARE INSTRUCTIONS   Take your antibiotic medicine as directed by your health care provider. Finish the antibiotic even if you start to feel better.  Take medicines only as directed by your health care provider.  Inform any sexual partners about the infection. They should also be treated.  Do not have sexual contact until your health care provider tells you it is okay.  Get plenty of rest.  Eat a well-balanced diet.  Drink enough fluids to keep your urine clear or pale yellow.  Keep all follow-up visits as directed by your health care provider. SEEK MEDICAL CARE IF:  You have painful urination.  You have abdominal pain.  You have vaginal discharge.  You have painful sexual intercourse.  You have bleeding between periods and after sex.  You have a fever. SEEK IMMEDIATE MEDICAL CARE IF:   You experience nausea or vomiting.  You experience excessive sweating (diaphoresis).  You have difficulty swallowing. MAKE SURE YOU:   Understand these instructions.  Will watch your condition.  Will get help right away if you are not doing well or get worse. Document Released: 10/04/2004 Document Revised: 05/11/2013 Document Reviewed: 09/01/2012 [facility] Patient Information 2015 [place], [place]. This information is not intended to  replace advice given to you by your health care provider. Make sure you discuss any questions you have with your health care provider.

## 2013-12-01 LAB — MATERNAL SCREEN, INTEGRATED #1

## 2013-12-08 ENCOUNTER — Telehealth: Payer: Self-pay | Admitting: Obstetrics & Gynecology

## 2013-12-08 NOTE — Telephone Encounter (Signed)
Pt states needs additional forms for FMLA to be completed. Pt informed to bring forms to office with pt name, phone number, dates missed due to sick and any other pertinent information and will complete and fax forms as pt requested. Pt verbalized understanding.

## 2013-12-23 ENCOUNTER — Ambulatory Visit (INDEPENDENT_AMBULATORY_CARE_PROVIDER_SITE_OTHER): Payer: BC Managed Care – PPO | Admitting: Advanced Practice Midwife

## 2013-12-23 ENCOUNTER — Encounter: Payer: Self-pay | Admitting: Advanced Practice Midwife

## 2013-12-23 VITALS — BP 104/58 | Wt 245.0 lb

## 2013-12-23 DIAGNOSIS — O09292 Supervision of pregnancy with other poor reproductive or obstetric history, second trimester: Secondary | ICD-10-CM

## 2013-12-23 DIAGNOSIS — Z1389 Encounter for screening for other disorder: Secondary | ICD-10-CM

## 2013-12-23 DIAGNOSIS — Z331 Pregnant state, incidental: Secondary | ICD-10-CM

## 2013-12-23 DIAGNOSIS — Z363 Encounter for antenatal screening for malformations: Secondary | ICD-10-CM

## 2013-12-23 DIAGNOSIS — Z3682 Encounter for antenatal screening for nuchal translucency: Secondary | ICD-10-CM

## 2013-12-23 DIAGNOSIS — O09892 Supervision of other high risk pregnancies, second trimester: Secondary | ICD-10-CM

## 2013-12-23 LAB — POCT URINALYSIS DIPSTICK
Blood, UA: NEGATIVE
Blood, UA: NEGATIVE
GLUCOSE UA: NEGATIVE
Glucose, UA: NEGATIVE
Ketones, UA: NEGATIVE
Ketones, UA: NEGATIVE
LEUKOCYTES UA: NEGATIVE
Leukocytes, UA: NEGATIVE
NITRITE UA: NEGATIVE
Nitrite, UA: NEGATIVE
PROTEIN UA: NEGATIVE
Protein, UA: NEGATIVE

## 2013-12-23 NOTE — Progress Notes (Signed)
G1P0 5680w2d Estimated Date of Delivery: 06/07/14  Blood pressure 104/58, weight 245 lb (111.131 kg), last menstrual period 08/20/2013.   BP weight and urine results all reviewed and noted.  Please refer to the obstetrical flow sheet for the fundal height and fetal heart rate documentation:  Patient denies any bleeding and no rupture of membranes symptoms or regular contractions. Patient is without complaints. All questions were answered.  Plan:  Continued routine obstetrical care, POC CHL today  Follow up in 3 weeks for OB appointment, anatomy scan

## 2013-12-24 LAB — GC/CHLAMYDIA PROBE AMP
CT Probe RNA: NEGATIVE
GC Probe RNA: NEGATIVE

## 2013-12-31 LAB — MATERNAL SCREEN, INTEGRATED #2
AFP MoM: 0.98
AFP, Serum: 26.7 ng/mL
Age risk Down Syndrome: 1:1100 {titer}
Calculated Gestational Age: 15.9
Crown Rump Length: 54.5 mm
Estriol Mom: 1.64
Estriol, Free: 1.09 ng/mL
INHIBIN A DIMERIC MAT SCREEN: 178 pg/mL
Inhibin A MoM: 1.27
MSS Down Syndrome: <1:5000 {titer}
NT MOM MAT SCREEN: 1.31
Nuchal Translucency: 1.7 mm
Number of fetuses: 1
PAPP-A MAT SCREEN: 426 ng/mL
PAPP-A MoM: 0.96
hCG MoM: 1.13
hCG, Serum: 38.3 IU/mL

## 2014-01-08 NOTE — L&D Delivery Note (Cosign Needed)
Delivery Note At 12:33 AM a viable female was delivered via  (Presentation:vertex ; loa ).  APGAR:8 ,9 ; weight  .   Placenta status:spont ,via shultz .  Cord:  with the following complications: none.  Cord pH: n/a  Anesthesia: Epidural  Episiotomy:  none Lacerations:  Right labial Suture Repair: 3.0 vicryl Est. Blood Loss150 (mL):    Mom to postpartum.  Baby to Couplet care / Skin to Skin.  Laurie Hooper, Laurie Hooper 06/14/2014, 12:53 AM

## 2014-01-14 ENCOUNTER — Ambulatory Visit (INDEPENDENT_AMBULATORY_CARE_PROVIDER_SITE_OTHER): Payer: BLUE CROSS/BLUE SHIELD | Admitting: Advanced Practice Midwife

## 2014-01-14 ENCOUNTER — Other Ambulatory Visit: Payer: Self-pay | Admitting: Advanced Practice Midwife

## 2014-01-14 ENCOUNTER — Ambulatory Visit (INDEPENDENT_AMBULATORY_CARE_PROVIDER_SITE_OTHER): Payer: BLUE CROSS/BLUE SHIELD

## 2014-01-14 VITALS — BP 112/72 | Wt 249.0 lb

## 2014-01-14 DIAGNOSIS — Z3402 Encounter for supervision of normal first pregnancy, second trimester: Secondary | ICD-10-CM

## 2014-01-14 DIAGNOSIS — Z331 Pregnant state, incidental: Secondary | ICD-10-CM

## 2014-01-14 DIAGNOSIS — Z36 Encounter for antenatal screening of mother: Secondary | ICD-10-CM

## 2014-01-14 DIAGNOSIS — Z363 Encounter for antenatal screening for malformations: Secondary | ICD-10-CM

## 2014-01-14 DIAGNOSIS — O468X2 Other antepartum hemorrhage, second trimester: Secondary | ICD-10-CM

## 2014-01-14 DIAGNOSIS — O43101 Malformation of placenta, unspecified, first trimester: Secondary | ICD-10-CM | POA: Insufficient documentation

## 2014-01-14 DIAGNOSIS — O418X20 Other specified disorders of amniotic fluid and membranes, second trimester, not applicable or unspecified: Secondary | ICD-10-CM | POA: Insufficient documentation

## 2014-01-14 DIAGNOSIS — Z1389 Encounter for screening for other disorder: Secondary | ICD-10-CM

## 2014-01-14 DIAGNOSIS — O43102 Malformation of placenta, unspecified, second trimester: Secondary | ICD-10-CM

## 2014-01-14 DIAGNOSIS — O43892 Other placental disorders, second trimester: Secondary | ICD-10-CM

## 2014-01-14 LAB — POCT URINALYSIS DIPSTICK
GLUCOSE UA: NEGATIVE
KETONES UA: NEGATIVE
Leukocytes, UA: NEGATIVE
Nitrite, UA: NEGATIVE
RBC UA: NEGATIVE

## 2014-01-14 NOTE — Progress Notes (Signed)
G1P0 7423w3d Estimated Date of Delivery: 06/07/14  Blood pressure 112/72, weight 249 lb (112.946 kg), last menstrual period 08/20/2013.   BP weight and urine results all reviewed and noted.  Please refer to the obstetrical flow sheet for the fundal height and fetal heart rate documentation: Pt had anatomy scan: U/S(19+3wks)-active fetus, meas c/w dates, fluid wnl, cx appears closed (3.5cm), bilateral adnexa appears WNL, FHR-148 bpm, female fetus, no major abnl noted except anterior Gr 0 placenta noted with many placental lakes noted within and an area of hemorrhage noted within the lower ut/caudal portion of placenta   Patient reports good fetal movement, denies any bleeding and no rupture of membranes symptoms or regular contractions. Patient is without complaints. All questions were answered.  Plan:  Continued routine obstetrical care, Placental lakes were present in 1st trimester US, so there is increased risk of decreased arterial blood flow/IUGR.  Will monitor with monthly growth scans/dopplers for now.  Consider twice weekly testing at 32 weeks.  Follow up in 4 weeks for OB appointment, growth us

## 2014-01-14 NOTE — Progress Notes (Signed)
U/S(19+3wks)-active fetus, meas c/w dates, fluid wnl, cx appears closed (3.5cm), bilateral adnexa appears WNL, FHR-148 bpm, female fetus, no major abnl noted except anterior Gr 0 placenta noted with many placental lakes noted within and an area of hemorrhage noted within the lower ut/caudal portion of placenta

## 2014-02-11 ENCOUNTER — Other Ambulatory Visit: Payer: BLUE CROSS/BLUE SHIELD

## 2014-02-11 ENCOUNTER — Encounter: Payer: BLUE CROSS/BLUE SHIELD | Admitting: Advanced Practice Midwife

## 2014-02-17 ENCOUNTER — Ambulatory Visit (INDEPENDENT_AMBULATORY_CARE_PROVIDER_SITE_OTHER): Payer: BLUE CROSS/BLUE SHIELD | Admitting: Advanced Practice Midwife

## 2014-02-17 ENCOUNTER — Ambulatory Visit (INDEPENDENT_AMBULATORY_CARE_PROVIDER_SITE_OTHER): Payer: BLUE CROSS/BLUE SHIELD

## 2014-02-17 ENCOUNTER — Encounter: Payer: Self-pay | Admitting: Advanced Practice Midwife

## 2014-02-17 VITALS — BP 120/54 | Wt 251.0 lb

## 2014-02-17 DIAGNOSIS — O43101 Malformation of placenta, unspecified, first trimester: Secondary | ICD-10-CM

## 2014-02-17 DIAGNOSIS — Z1389 Encounter for screening for other disorder: Secondary | ICD-10-CM

## 2014-02-17 DIAGNOSIS — Z331 Pregnant state, incidental: Secondary | ICD-10-CM

## 2014-02-17 DIAGNOSIS — Z3402 Encounter for supervision of normal first pregnancy, second trimester: Secondary | ICD-10-CM

## 2014-02-17 DIAGNOSIS — O43191 Other malformation of placenta, first trimester: Secondary | ICD-10-CM

## 2014-02-17 NOTE — Progress Notes (Signed)
G1P0 7731w2d Estimated Date of Delivery: 06/07/14  Blood pressure 120/54, weight 251 lb (113.853 kg), last menstrual period 08/20/2013.   BP weight and urine results all reviewed and noted.  Please refer to the obstetrical flow sheet for the fundal height and fetal heart rate documentation:U/S(24+2wks)-vtx active fetus, FHR- 162 bpm, fluid WNL, cx appears closed (4.2cm), anterior Gr 1 placenta (placental lakes remain although area of hemorrhage noted has resolved), approp growth EFw 1 lb 10 oz (55th%tile), female fetus  Patient reports good fetal movement, denies any bleeding and no rupture of membranes symptoms or regular contractions. Patient is without complaints. All questions were answered.  Plan:  Continued routine obstetrical care, growth us q month, twice weekly testing >32 weeks for 1st trimester placental lakes  Follow up in 4 weeks for OB appointment, PN2, growth us

## 2014-02-17 NOTE — Progress Notes (Signed)
U/S(24+2wks)-vtx active fetus, FHR- 162 bpm, fluid WNL, cx appears closed (4.2cm), anterior Gr 1 placenta (placental lakes remain although area of hemorrhage noted has resolved), approp growth EFw 1 lb 10 oz (55th%tile), female fetus

## 2014-02-17 NOTE — Patient Instructions (Signed)

## 2014-03-18 ENCOUNTER — Ambulatory Visit: Payer: BLUE CROSS/BLUE SHIELD

## 2014-03-18 ENCOUNTER — Ambulatory Visit (INDEPENDENT_AMBULATORY_CARE_PROVIDER_SITE_OTHER): Payer: BLUE CROSS/BLUE SHIELD | Admitting: Advanced Practice Midwife

## 2014-03-18 ENCOUNTER — Other Ambulatory Visit: Payer: BLUE CROSS/BLUE SHIELD

## 2014-03-18 VITALS — BP 118/60 | HR 68 | Wt 259.0 lb

## 2014-03-18 DIAGNOSIS — Z0184 Encounter for antibody response examination: Secondary | ICD-10-CM

## 2014-03-18 DIAGNOSIS — Z131 Encounter for screening for diabetes mellitus: Secondary | ICD-10-CM

## 2014-03-18 DIAGNOSIS — Z3403 Encounter for supervision of normal first pregnancy, third trimester: Secondary | ICD-10-CM

## 2014-03-18 DIAGNOSIS — Z331 Pregnant state, incidental: Secondary | ICD-10-CM

## 2014-03-18 DIAGNOSIS — Z1389 Encounter for screening for other disorder: Secondary | ICD-10-CM

## 2014-03-18 DIAGNOSIS — Z113 Encounter for screening for infections with a predominantly sexual mode of transmission: Secondary | ICD-10-CM

## 2014-03-18 DIAGNOSIS — Z114 Encounter for screening for human immunodeficiency virus [HIV]: Secondary | ICD-10-CM

## 2014-03-18 LAB — POCT URINALYSIS DIPSTICK
Glucose, UA: NEGATIVE
Glucose, UA: NEGATIVE
Ketones, UA: NEGATIVE
NITRITE UA: NEGATIVE
Protein, UA: NEGATIVE
Protein, UA: NEGATIVE

## 2014-03-18 NOTE — Progress Notes (Signed)
G1P0 1367w3d Estimated Date of Delivery: 06/07/14  Blood pressure 118/60, pulse 68, weight 259 lb (117.482 kg), last menstrual period 08/20/2013.   BP weight and urine results all reviewed and noted.  Please refer to the obstetrical flow sheet for the fundal height and fetal heart rate documentation: Had growth us ordered today dt 1st trimester placental lakes, but US tech out today.  Patient reports good fetal movement, denies any bleeding and no rupture of membranes symptoms or regular contractions. Patient is without complaints. All questions were answered.  Plan:  Continued routine obstetrical care, PN2 today.  Reschedule growth US for asap. Follow up in 3 weeks for OB appointment,

## 2014-03-19 LAB — HIV ANTIBODY (ROUTINE TESTING W REFLEX): HIV Screen 4th Generation wRfx: NONREACTIVE

## 2014-03-19 LAB — CBC
HEMATOCRIT: 34.2 % (ref 34.0–46.6)
Hemoglobin: 12.3 g/dL (ref 11.1–15.9)
MCH: 29 pg (ref 26.6–33.0)
MCHC: 36 g/dL — AB (ref 31.5–35.7)
MCV: 81 fL (ref 79–97)
Platelets: 207 10*3/uL (ref 150–379)
RBC: 4.24 x10E6/uL (ref 3.77–5.28)
RDW: 13 % (ref 12.3–15.4)
WBC: 8.5 10*3/uL (ref 3.4–10.8)

## 2014-03-19 LAB — ANTIBODY SCREEN: Antibody Screen: NEGATIVE

## 2014-03-19 LAB — GLUCOSE TOLERANCE, 2 HOURS W/ 1HR
Glucose, 1 hour: 177 mg/dL (ref 65–179)
Glucose, 2 hour: 146 mg/dL (ref 65–152)
Glucose, Fasting: 102 mg/dL — ABNORMAL HIGH (ref 65–91)

## 2014-03-19 LAB — RPR: RPR Ser Ql: NONREACTIVE

## 2014-03-19 LAB — HSV 2 ANTIBODY, IGG

## 2014-03-24 ENCOUNTER — Ambulatory Visit (INDEPENDENT_AMBULATORY_CARE_PROVIDER_SITE_OTHER): Payer: BLUE CROSS/BLUE SHIELD

## 2014-03-24 ENCOUNTER — Other Ambulatory Visit: Payer: Self-pay | Admitting: Advanced Practice Midwife

## 2014-03-24 DIAGNOSIS — O468X2 Other antepartum hemorrhage, second trimester: Principal | ICD-10-CM

## 2014-03-24 DIAGNOSIS — O43191 Other malformation of placenta, first trimester: Secondary | ICD-10-CM

## 2014-03-24 DIAGNOSIS — O418X20 Other specified disorders of amniotic fluid and membranes, second trimester, not applicable or unspecified: Secondary | ICD-10-CM

## 2014-03-24 DIAGNOSIS — O43101 Malformation of placenta, unspecified, first trimester: Secondary | ICD-10-CM

## 2014-03-24 NOTE — Progress Notes (Signed)
Growth US today at 29+[redacted] weeks GA.  Single, active female fetus in a cephalic presentation.  FHR of 167 bpm.  Fluid is normal with SVP of 4.94 cm and AFI of 14. 87cm.  Anterior placenta with venous lakes again noted today. EFW today of 1409 g (55%) which is consistent with dating. Bilateral ovaries appear within normal limits.

## 2014-04-08 ENCOUNTER — Ambulatory Visit (INDEPENDENT_AMBULATORY_CARE_PROVIDER_SITE_OTHER): Payer: BLUE CROSS/BLUE SHIELD | Admitting: Obstetrics & Gynecology

## 2014-04-08 VITALS — BP 104/60 | HR 96 | Wt 262.5 lb

## 2014-04-08 DIAGNOSIS — Z1389 Encounter for screening for other disorder: Secondary | ICD-10-CM

## 2014-04-08 DIAGNOSIS — Z3403 Encounter for supervision of normal first pregnancy, third trimester: Secondary | ICD-10-CM

## 2014-04-08 DIAGNOSIS — Z331 Pregnant state, incidental: Secondary | ICD-10-CM

## 2014-04-08 LAB — POCT URINALYSIS DIPSTICK
Blood, UA: NEGATIVE
Glucose, UA: NEGATIVE
Ketones, UA: NEGATIVE
Leukocytes, UA: NEGATIVE
Nitrite, UA: NEGATIVE
PROTEIN UA: NEGATIVE

## 2014-04-08 NOTE — Progress Notes (Signed)
G1P0 6838w3d Estimated Date of Delivery: 06/07/14  Blood pressure 104/60, pulse 96, weight 262 lb 8 oz (119.069 kg), last menstrual period 08/20/2013.   BP weight and urine results all reviewed and noted.  Please refer to the obstetrical flow sheet for the fundal height and fetal heart rate documentation:  Patient reports good fetal movement, denies any bleeding and no rupture of membranes symptoms or regular contractions. Patient is without complaints. All questions were answered.  Plan:  Continued routine obstetrical care,   Follow up in 2 weeks for OB appointment,

## 2014-04-22 ENCOUNTER — Encounter: Payer: Self-pay | Admitting: Family Medicine

## 2014-04-22 ENCOUNTER — Encounter: Payer: BLUE CROSS/BLUE SHIELD | Admitting: Advanced Practice Midwife

## 2014-04-28 ENCOUNTER — Ambulatory Visit (INDEPENDENT_AMBULATORY_CARE_PROVIDER_SITE_OTHER): Payer: BLUE CROSS/BLUE SHIELD | Admitting: Advanced Practice Midwife

## 2014-04-28 VITALS — BP 116/60 | HR 84 | Wt 266.0 lb

## 2014-04-28 DIAGNOSIS — O43101 Malformation of placenta, unspecified, first trimester: Secondary | ICD-10-CM

## 2014-04-28 DIAGNOSIS — Z1389 Encounter for screening for other disorder: Secondary | ICD-10-CM

## 2014-04-28 DIAGNOSIS — Z3403 Encounter for supervision of normal first pregnancy, third trimester: Secondary | ICD-10-CM

## 2014-04-28 DIAGNOSIS — Z331 Pregnant state, incidental: Secondary | ICD-10-CM

## 2014-04-28 LAB — POCT URINALYSIS DIPSTICK
Glucose, UA: NEGATIVE
Ketones, UA: NEGATIVE
Nitrite, UA: NEGATIVE
PROTEIN UA: NEGATIVE
RBC UA: NEGATIVE

## 2014-04-28 NOTE — Progress Notes (Signed)
G1P0 6574w2d Estimated Date of Delivery: 06/07/14  Blood pressure 116/60, pulse 84, weight 266 lb (120.657 kg), last menstrual period 08/20/2013.   BP weight and urine results all reviewed and noted.  Please refer to the obstetrical flow sheet for the fundal height and fetal heart rate documentation:  Patient reports good fetal movement, denies any bleeding and no rupture of membranes symptoms or regular contractions. Patient is without complaints. All questions were answered.  Plan:  Continued routine obstetrical care,   Follow up in 1 weeks for OB appointment, EFW (1st trimester placental lakes)

## 2014-05-07 ENCOUNTER — Ambulatory Visit (INDEPENDENT_AMBULATORY_CARE_PROVIDER_SITE_OTHER): Payer: BLUE CROSS/BLUE SHIELD

## 2014-05-07 DIAGNOSIS — O418X20 Other specified disorders of amniotic fluid and membranes, second trimester, not applicable or unspecified: Secondary | ICD-10-CM

## 2014-05-07 DIAGNOSIS — O43101 Malformation of placenta, unspecified, first trimester: Secondary | ICD-10-CM

## 2014-05-07 DIAGNOSIS — O468X2 Other antepartum hemorrhage, second trimester: Principal | ICD-10-CM

## 2014-05-07 DIAGNOSIS — O43191 Other malformation of placenta, first trimester: Secondary | ICD-10-CM

## 2014-05-07 NOTE — Progress Notes (Signed)
US 8256w4d measurements c/w dates, ant pl gr 2 venous lakes again noted wnl, normal ov's bilat, afi 14.7cm, EFW 2827g (6lbs 4oz)

## 2014-05-14 ENCOUNTER — Encounter: Payer: Self-pay | Admitting: Obstetrics and Gynecology

## 2014-05-14 ENCOUNTER — Ambulatory Visit (INDEPENDENT_AMBULATORY_CARE_PROVIDER_SITE_OTHER): Payer: BLUE CROSS/BLUE SHIELD | Admitting: Obstetrics and Gynecology

## 2014-05-14 VITALS — BP 124/66 | HR 100 | Wt 268.0 lb

## 2014-05-14 DIAGNOSIS — Z3403 Encounter for supervision of normal first pregnancy, third trimester: Secondary | ICD-10-CM

## 2014-05-14 DIAGNOSIS — Z1389 Encounter for screening for other disorder: Secondary | ICD-10-CM

## 2014-05-14 DIAGNOSIS — Z369 Encounter for antenatal screening, unspecified: Secondary | ICD-10-CM

## 2014-05-14 DIAGNOSIS — Z331 Pregnant state, incidental: Secondary | ICD-10-CM

## 2014-05-14 LAB — OB RESULTS CONSOLE GBS: GBS: NEGATIVE

## 2014-05-14 LAB — OB RESULTS CONSOLE GC/CHLAMYDIA
CHLAMYDIA, DNA PROBE: NEGATIVE
GC PROBE AMP, GENITAL: NEGATIVE

## 2014-05-14 NOTE — Progress Notes (Signed)
Patient ID: Laurie Hooper, female   DOB: 02/28/1990, 24 y.o.   MRN: [id number]  G1P0 3258w4d Estimated Date of Delivery: 06/07/14  Blood pressure 124/66, pulse 100, weight 268 lb (121.564 kg), last menstrual period 08/20/2013.   refer to the ob flow sheet for FH and FHR, also BP, Wt, Urine results:notable for negative   Patient reports + good fetal movement, denies any bleeding and no rupture of membranes symptoms or regular contractions. Patient complaints: Patient has no complaints at this time. She expresses interest in contraceptive methods but is unsure which at this time.  FHR: 169 bpm FH: 36 cm Cervix: visual; normal, closed  Questions were answered. Assessment: Pregnancy 8158w4d,   gbs gc/chL DONE Plan:  Continued routine obstetrical care, GBS screening done today, pt given number [phone] and [facility]   F/u in 1 weeks for pnx    This chart was SCRIBED for Christin BachJohn Vercie Pokorny, MD by [name], ED Scribe. This patient was seen in room 1 and the patient's care was started at 1:19 PM.  I personally performed the services described in this documentation, which was SCRIBED in my presence. The recorded information has been reviewed and considered accurate. It has been edited as necessary during review. Tilda BurrowFERGUSON,Sharone Picchi V, MD

## 2014-05-14 NOTE — Progress Notes (Signed)
Pt denies any problems or concerns at this time. Pt unable to void at this time.  

## 2014-05-16 LAB — STREP GP B NAA: STREP GROUP B AG: NEGATIVE

## 2014-05-17 ENCOUNTER — Telehealth: Payer: Self-pay | Admitting: *Deleted

## 2014-05-17 LAB — GC/CHLAMYDIA PROBE AMP
Chlamydia trachomatis, NAA: NEGATIVE
NEISSERIA GONORRHOEAE BY PCR: NEGATIVE

## 2014-05-17 NOTE — Telephone Encounter (Signed)
Pt states she wanting to discuss the need for FMLA due to missing work for appt and 1 day for pregnancy related sickness. Pt informed do have her forms will complete and have ready for her at her appt this Friday. Pt verbalized understanding.

## 2014-05-21 ENCOUNTER — Encounter: Payer: Self-pay | Admitting: Obstetrics and Gynecology

## 2014-05-21 ENCOUNTER — Ambulatory Visit (INDEPENDENT_AMBULATORY_CARE_PROVIDER_SITE_OTHER): Payer: BLUE CROSS/BLUE SHIELD | Admitting: Obstetrics and Gynecology

## 2014-05-21 VITALS — BP 124/70 | Wt 271.0 lb

## 2014-05-21 DIAGNOSIS — Z3403 Encounter for supervision of normal first pregnancy, third trimester: Secondary | ICD-10-CM

## 2014-05-21 DIAGNOSIS — Z331 Pregnant state, incidental: Secondary | ICD-10-CM

## 2014-05-21 DIAGNOSIS — Z1389 Encounter for screening for other disorder: Secondary | ICD-10-CM

## 2014-05-21 LAB — POCT URINALYSIS DIPSTICK
Blood, UA: NEGATIVE
Glucose, UA: NEGATIVE
Ketones, UA: NEGATIVE
LEUKOCYTES UA: NEGATIVE
NITRITE UA: NEGATIVE
Protein, UA: NEGATIVE

## 2014-05-21 NOTE — Patient Instructions (Signed)
[email or web address]  

## 2014-05-21 NOTE — Progress Notes (Signed)
Patient ID: Laurie Hooper, female   DOB: 06/27/1990, [age] y.o.   MRN: [id number]  G1P0 4456w4d Estimated Date of Delivery: 06/07/14  Blood pressure 124/70, weight 271 lb (122.925 kg), last menstrual period 08/20/2013.   refer to the ob flow sheet for FH and FHR, also BP, Wt, Urine results:notable for negative  Patient reports  + good fetal movement, denies any bleeding and no rupture of membranes symptoms or regular contractions. Patient complaints: Patient has no complaints at this time. She states she [facility] although she did attempt to call to set up a tour; she was instead referred to the website, where there was no information.   FHR: 168 bpm  FH: 37 cm  Questions were answered. Assessment: Pregnancy 6156w4d, LROB Plan:  Continued routine obstetrical care,  F/u in 1 week for pnx care  This chart was SCRIBED for Laurie BachJohn Quinette Hentges, MD by Laurie Hooper, ED Scribe. This patient was seen in room 3 and the patient's care was started at 11:52 AM.  I personally performed the services described in this documentation, which was SCRIBED in my presence. The recorded information has been reviewed and considered accurate. It has been edited as necessary during review. Laurie BurrowFERGUSON,Jireh Vinas V, MD

## 2014-05-21 NOTE — Progress Notes (Signed)
Pt denies any problems or concerns at this time.  

## 2014-05-28 ENCOUNTER — Encounter: Payer: Self-pay | Admitting: Obstetrics and Gynecology

## 2014-05-28 ENCOUNTER — Ambulatory Visit (INDEPENDENT_AMBULATORY_CARE_PROVIDER_SITE_OTHER): Payer: BLUE CROSS/BLUE SHIELD | Admitting: Obstetrics and Gynecology

## 2014-05-28 VITALS — BP 120/72 | HR 92 | Wt 269.0 lb

## 2014-05-28 DIAGNOSIS — Z1389 Encounter for screening for other disorder: Secondary | ICD-10-CM

## 2014-05-28 DIAGNOSIS — Z3403 Encounter for supervision of normal first pregnancy, third trimester: Secondary | ICD-10-CM

## 2014-05-28 DIAGNOSIS — Z331 Pregnant state, incidental: Secondary | ICD-10-CM

## 2014-05-28 LAB — POCT URINALYSIS DIPSTICK
GLUCOSE UA: NEGATIVE
Ketones, UA: NEGATIVE
Leukocytes, UA: NEGATIVE
NITRITE UA: NEGATIVE
Protein, UA: NEGATIVE
RBC UA: NEGATIVE

## 2014-05-28 NOTE — Progress Notes (Signed)
Pt denies any problems or concerns at this time.  

## 2014-05-28 NOTE — Progress Notes (Signed)
G1P0 724w4d Estimated Date of Delivery: 06/07/14  Blood pressure 120/72, pulse 92, weight 269 lb (122.018 kg), last menstrual period 08/20/2013.   refer to the ob flow sheet for FH and FHR, also BP, Wt, Urine results:notable for neg  Patient reports   good fetal movement, denies any bleeding and no rupture of membranes symptoms or regular contractions. Patient complaints:none, pelvic pressure +. cx exam done Questions were answered. Assessment:  Plan:  Continued routine obstetrical care,   F/u in 1 weeks for pnx LROB

## 2014-06-04 ENCOUNTER — Ambulatory Visit (INDEPENDENT_AMBULATORY_CARE_PROVIDER_SITE_OTHER): Payer: BLUE CROSS/BLUE SHIELD | Admitting: Obstetrics & Gynecology

## 2014-06-04 ENCOUNTER — Encounter: Payer: Self-pay | Admitting: Obstetrics & Gynecology

## 2014-06-04 VITALS — BP 110/70 | HR 76 | Wt 278.0 lb

## 2014-06-04 DIAGNOSIS — Z1389 Encounter for screening for other disorder: Secondary | ICD-10-CM

## 2014-06-04 DIAGNOSIS — Z331 Pregnant state, incidental: Secondary | ICD-10-CM

## 2014-06-04 DIAGNOSIS — Z3403 Encounter for supervision of normal first pregnancy, third trimester: Secondary | ICD-10-CM

## 2014-06-04 LAB — POCT URINALYSIS DIPSTICK
Blood, UA: NEGATIVE
Glucose, UA: NEGATIVE
Ketones, UA: NEGATIVE
Leukocytes, UA: NEGATIVE
Nitrite, UA: NEGATIVE
Protein, UA: NEGATIVE

## 2014-06-04 NOTE — Progress Notes (Signed)
G1P0 6242w4d Estimated Date of Delivery: 06/07/14  Blood pressure 110/70, pulse 76, weight 278 lb (126.1 kg), last menstrual period 08/20/2013.   BP weight and urine results all reviewed and noted.  Please refer to the obstetrical flow sheet for the fundal height and fetal heart rate documentation:  Patient reports good fetal movement, denies any bleeding and no rupture of membranes symptoms or regular contractions. Patient is without complaints. All questions were answered.  Plan:  Continued routine obstetrical care,   Follow up in 1 weeks for OB appointment, induction at 41 weeks if undelivered

## 2014-06-09 ENCOUNTER — Telehealth: Payer: Self-pay | Admitting: Obstetrics and Gynecology

## 2014-06-09 NOTE — Telephone Encounter (Signed)
Pt aware Breast pump Rx left at front desk for pick up.

## 2014-06-11 ENCOUNTER — Encounter: Payer: Self-pay | Admitting: Obstetrics and Gynecology

## 2014-06-11 ENCOUNTER — Ambulatory Visit (INDEPENDENT_AMBULATORY_CARE_PROVIDER_SITE_OTHER): Payer: BLUE CROSS/BLUE SHIELD | Admitting: Obstetrics and Gynecology

## 2014-06-11 ENCOUNTER — Telehealth (HOSPITAL_COMMUNITY): Payer: Self-pay | Admitting: *Deleted

## 2014-06-11 ENCOUNTER — Encounter: Payer: BLUE CROSS/BLUE SHIELD | Admitting: Obstetrics & Gynecology

## 2014-06-11 VITALS — BP 126/68 | HR 68 | Wt 277.8 lb

## 2014-06-11 DIAGNOSIS — Z3A39 39 weeks gestation of pregnancy: Secondary | ICD-10-CM | POA: Diagnosis not present

## 2014-06-11 DIAGNOSIS — Z1389 Encounter for screening for other disorder: Secondary | ICD-10-CM

## 2014-06-11 DIAGNOSIS — Z3403 Encounter for supervision of normal first pregnancy, third trimester: Secondary | ICD-10-CM

## 2014-06-11 DIAGNOSIS — Z331 Pregnant state, incidental: Secondary | ICD-10-CM

## 2014-06-11 LAB — POCT URINALYSIS DIPSTICK
Blood, UA: NEGATIVE
Glucose, UA: NEGATIVE
KETONES UA: NEGATIVE
Leukocytes, UA: NEGATIVE
Nitrite, UA: NEGATIVE
Protein, UA: NEGATIVE

## 2014-06-11 NOTE — Telephone Encounter (Signed)
Preadmission screen  

## 2014-06-11 NOTE — Progress Notes (Signed)
Patient ID: Laurie Hooper, female   DOB: 04/08/1990, 24 y.o.   MRN: [id number]  G1P0 9673w4d Estimated Date of Delivery: 06/07/14  Blood pressure 126/68, pulse 68, weight 277 lb 12.8 oz (126.009 kg), last menstrual period 08/20/2013.   refer to the ob flow sheet for FH and FHR, also BP, Wt, Urine results:notable for negative  Patient reports  + good fetal movement, denies any bleeding and no rupture of membranes symptoms or regular contractions. Patient complaints: Pt complains of constipation. Pt took child care classes.  FHR-146 bpm Cervix ~3 cm.  Questions were answered. Assessment: 8873w4d, G1P0                          Cervix favorable                         GBS neg,                             Plan:  Continued routine obstetrical care. Continue Miralax. Take Dulcolax suppository.  F/u in induction Monday 06/14/14 at 7 am likely pitocin/arom  This chart was scribed for Christin BachJohn Ferguson, MD by Gwenyth Oberatherine Macek, ED Scribe. This patient was seen in room 1 and the patient's care was started at 1:07 PM.   I personally performed the services described in this documentation, which was SCRIBED in my presence. The recorded information has been reviewed and considered accurate. It has been edited as necessary during review. Tilda BurrowFERGUSON,JOHN V, MD

## 2014-06-13 ENCOUNTER — Inpatient Hospital Stay (HOSPITAL_COMMUNITY): Payer: BLUE CROSS/BLUE SHIELD | Admitting: Anesthesiology

## 2014-06-13 ENCOUNTER — Encounter (HOSPITAL_COMMUNITY): Payer: Self-pay

## 2014-06-13 ENCOUNTER — Inpatient Hospital Stay (HOSPITAL_COMMUNITY)
Admission: AD | Admit: 2014-06-13 | Discharge: 2014-06-16 | DRG: 775 | Disposition: A | Discharge status: Home / Self Care | Payer: BLUE CROSS/BLUE SHIELD | Source: Ambulatory Visit | Attending: Obstetrics & Gynecology | Admitting: Obstetrics & Gynecology

## 2014-06-13 DIAGNOSIS — Z23 Encounter for immunization: Secondary | ICD-10-CM | POA: Diagnosis not present

## 2014-06-13 DIAGNOSIS — N858 Other specified noninflammatory disorders of uterus: Secondary | ICD-10-CM | POA: Diagnosis present

## 2014-06-13 DIAGNOSIS — O480 Post-term pregnancy: Principal | ICD-10-CM | POA: Diagnosis present

## 2014-06-13 DIAGNOSIS — Z87891 Personal history of nicotine dependence: Secondary | ICD-10-CM | POA: Diagnosis not present

## 2014-06-13 DIAGNOSIS — Z3A40 40 weeks gestation of pregnancy: Secondary | ICD-10-CM | POA: Diagnosis present

## 2014-06-13 HISTORY — DX: Other specified health status: Z78.9

## 2014-06-13 LAB — CBC
HCT: 34.5 % — ABNORMAL LOW (ref 36.0–46.0)
Hemoglobin: 12 g/dL (ref 12.0–15.0)
MCH: 27.8 pg (ref 26.0–34.0)
MCHC: 34.8 g/dL (ref 30.0–36.0)
MCV: 79.9 fL (ref 78.0–100.0)
PLATELETS: 171 10*3/uL (ref 150–400)
RBC: 4.32 MIL/uL (ref 3.87–5.11)
RDW: 13.4 % (ref 11.5–15.5)
WBC: 8.7 10*3/uL (ref 4.0–10.5)

## 2014-06-13 LAB — ABO/RH: ABO/RH(D): O POS

## 2014-06-13 LAB — RAPID HIV SCREEN (HIV 1/2 AB+AG)
HIV 1/2 ANTIBODIES: NONREACTIVE
HIV-1 P24 ANTIGEN - HIV24: NONREACTIVE

## 2014-06-13 LAB — TYPE AND SCREEN
ABO/RH(D): O POS
ANTIBODY SCREEN: NEGATIVE

## 2014-06-13 MED ORDER — DIPHENHYDRAMINE HCL 50 MG/ML IJ SOLN: 12.5000 mg | INTRAMUSCULAR | Status: DC | PRN

## 2014-06-13 MED ORDER — PHENYLEPHRINE 40 MCG/ML (10ML) SYRINGE FOR IV PUSH (FOR BLOOD PRESSURE SUPPORT)
80.0000 ug | PREFILLED_SYRINGE | INTRAVENOUS | Status: DC | PRN
  Filled 2014-06-13: qty 2

## 2014-06-13 MED ORDER — CITRIC ACID-SODIUM CITRATE 334-500 MG/5ML PO SOLN: 30.0000 mL | ORAL | Status: DC | PRN

## 2014-06-13 MED ORDER — LIDOCAINE HCL (PF) 1 % IJ SOLN
INTRAMUSCULAR | Status: DC | PRN
  Administered 2014-06-13 (×2): 5 mL

## 2014-06-13 MED ORDER — ACETAMINOPHEN 325 MG PO TABS
650.0000 mg | ORAL_TABLET | ORAL | Status: DC | PRN
  Filled 2014-06-13: qty 2

## 2014-06-13 MED ORDER — EPHEDRINE 5 MG/ML INJ
10.0000 mg | INTRAVENOUS | Status: DC | PRN
  Filled 2014-06-13: qty 2

## 2014-06-13 MED ORDER — FLEET ENEMA 7-19 GM/118ML RE ENEM: 1.0000 | ENEMA | Freq: Every day | RECTAL | Status: DC | PRN

## 2014-06-13 MED ORDER — FENTANYL 2.5 MCG/ML BUPIVACAINE 1/10 % EPIDURAL INFUSION (WH - ANES)
16.0000 mL/h | INTRAMUSCULAR | Status: DC | PRN
  Administered 2014-06-13 (×3): 16 mL/h via EPIDURAL
  Filled 2014-06-13 (×2): qty 125

## 2014-06-13 MED ORDER — OXYCODONE-ACETAMINOPHEN 5-325 MG PO TABS
1.0000 | ORAL_TABLET | ORAL | Status: DC | PRN
  Filled 2014-06-13 (×2): qty 1

## 2014-06-13 MED ORDER — LIDOCAINE HCL (PF) 1 % IJ SOLN
30.0000 mL | INTRAMUSCULAR | Status: DC | PRN
  Filled 2014-06-13: qty 30

## 2014-06-13 MED ORDER — FENTANYL 2.5 MCG/ML BUPIVACAINE 1/10 % EPIDURAL INFUSION (WH - ANES)
14.0000 mL/h | INTRAMUSCULAR | Status: DC | PRN
  Filled 2014-06-13: qty 125

## 2014-06-13 MED ORDER — LACTATED RINGERS IV SOLN
500.0000 mL | INTRAVENOUS | Status: DC | PRN
  Administered 2014-06-13: 500 mL via INTRAVENOUS

## 2014-06-13 MED ORDER — OXYTOCIN 40 UNITS IN LACTATED RINGERS INFUSION - SIMPLE MED
62.5000 mL/h | INTRAVENOUS | Status: DC
  Administered 2014-06-14: 62.5 mL/h via INTRAVENOUS
  Filled 2014-06-13: qty 1000

## 2014-06-13 MED ORDER — OXYCODONE-ACETAMINOPHEN 5-325 MG PO TABS
2.0000 | ORAL_TABLET | ORAL | Status: DC | PRN
  Filled 2014-06-13: qty 2

## 2014-06-13 MED ORDER — PHENYLEPHRINE 40 MCG/ML (10ML) SYRINGE FOR IV PUSH (FOR BLOOD PRESSURE SUPPORT)
80.0000 ug | PREFILLED_SYRINGE | INTRAVENOUS | Status: DC | PRN
  Filled 2014-06-13: qty 2
  Filled 2014-06-13: qty 20

## 2014-06-13 MED ORDER — TERBUTALINE SULFATE 1 MG/ML IJ SOLN: 0.2500 mg | Freq: Once | INTRAMUSCULAR | Status: AC | PRN

## 2014-06-13 MED ORDER — OXYTOCIN 40 UNITS IN LACTATED RINGERS INFUSION - SIMPLE MED
1.0000 m[IU]/min | INTRAVENOUS | Status: DC
  Administered 2014-06-13: 2 m[IU]/min via INTRAVENOUS

## 2014-06-13 MED ORDER — OXYTOCIN BOLUS FROM INFUSION
500.0000 mL | INTRAVENOUS | Status: DC
  Administered 2014-06-14: 500 mL via INTRAVENOUS

## 2014-06-13 MED ORDER — ONDANSETRON HCL 4 MG/2ML IJ SOLN: 4.0000 mg | Freq: Four times a day (QID) | INTRAMUSCULAR | Status: DC | PRN

## 2014-06-13 MED ORDER — LACTATED RINGERS IV SOLN
INTRAVENOUS | Status: DC
Start: 2014-06-13 — End: 2014-06-16
  Administered 2014-06-13 (×3): via INTRAVENOUS

## 2014-06-13 NOTE — Anesthesia Procedure Notes (Signed)
Epidural Patient location during procedure: OB Start time: 06/13/2014 12:17 PM  Staffing Anesthesiologist: Mal AmabileFOSTER, Juwana Thoreson Performed by: anesthesiologist   Preanesthetic Checklist Completed: patient identified, site marked, surgical consent, pre-op evaluation, timeout performed, IV checked, risks and benefits discussed and monitors and equipment checked  Epidural Patient position: sitting Prep: site prepped and draped and DuraPrep Patient monitoring: continuous pulse ox and blood pressure Approach: midline Location: L3-L4 Injection technique: LOR air  Needle:  Needle type: Tuohy  Needle gauge: 17 G Needle length: 9 cm and 9 Needle insertion depth: 5 cm cm Catheter type: closed end flexible Catheter size: 19 Gauge Catheter at skin depth: 10 cm Test dose: negative and Other  Assessment Events: blood not aspirated, injection not painful, no injection resistance, negative IV test and no paresthesia  Additional Notes Patient identified. Risks and benefits discussed including failed block, incomplete  Pain control, post dural puncture headache, nerve damage, paralysis, blood pressure Changes, nausea, vomiting, reactions to medications-both toxic and allergic and post Partum back pain. All questions were answered. Patient expressed understanding and wished to proceed. Sterile technique was used throughout procedure. Epidural site was Dressed with sterile barrier dressing. No paresthesias, signs of intravascular injection Or signs of intrathecal spread were encountered.  Patient was more comfortable after the epidural was dosed. Please see RN's note for documentation of vital signs and FHR which are stable.\

## 2014-06-13 NOTE — Progress Notes (Signed)
Dr. Delanna AhmadiLawler notified of pt in MAU for eval of ctx's. Cervix 4/80/-1, intact, gbs negative, ctx's q3, orders to admit

## 2014-06-13 NOTE — MAU Note (Signed)
Pt states here for contractions q5 minutes apart. Was 3cm in office Thursday.

## 2014-06-13 NOTE — Anesthesia Preprocedure Evaluation (Signed)
Anesthesia Evaluation  Patient identified by MRN, date of birth, ID band Patient awake    Reviewed: Allergy & Precautions, Patient's Chart, lab work & pertinent test results  Airway Mallampati: II  TM Distance: >3 FB Neck ROM: Full    Dental no notable dental hx. (+) Teeth Intact   Pulmonary former smoker,    Pulmonary exam normal       Cardiovascular negative cardio ROS Normal cardiovascular examRhythm:Regular Rate:Normal     Neuro/Psych negative neurological ROS  negative psych ROS   GI/Hepatic negative GI ROS, Neg liver ROS,   Endo/Other  Obesity  Renal/GU negative Renal ROS  negative genitourinary   Musculoskeletal negative musculoskeletal ROS (+)   Abdominal   Peds  Hematology negative hematology ROS (+)   Anesthesia Other Findings   Reproductive/Obstetrics (+) Pregnancy                             Anesthesia Physical Anesthesia Plan  ASA: II  Anesthesia Plan: Epidural   Post-op Pain Management:    Induction:   Airway Management Planned: Natural Airway  Additional Equipment:   Intra-op Plan:   Post-operative Plan:   Informed Consent: I have reviewed the patients History and Physical, chart, labs and discussed the procedure including the risks, benefits and alternatives for the proposed anesthesia with the patient or authorized representative who has indicated his/her understanding and acceptance.     Plan Discussed with: Anesthesiologist  Anesthesia Plan Comments:         Anesthesia Quick Evaluation

## 2014-06-13 NOTE — Progress Notes (Addendum)
Patient ID: Laurie Hooper, female   DOB: 11/09/1990, 24 y.o.   MRN: [id number] G1P0 2774w6d   S:  Epidural working well. No significant pain. Feeling fetal movement.   O:  Filed Vitals:   06/13/14 1532  BP: 130/78  Pulse: 104  Temp:   Resp: 20   Patient resting comfortable  Dilation: 6 Effacement (%): 80 Cervical Position: Posterior Station: -1 Presentation: Vertex Exam by:: k fields, rn  A/P:  Patient in spontaneous labor. No cervical change for the last four hours  FHT Category I. Some decreased variability intermittently but < 15 minutes duration with spontaneous recovery to moderate variability with good accelerations and no decelerations noted Contraction pattern with mild contractions every 2-5 minutes Discussed augmentation with patient including risks and benefits Start pitocin and advance slowly to achieve adequate contraction pattern

## 2014-06-13 NOTE — Progress Notes (Signed)
Laurie Hooper is a 24 y.o. G1P0 at 6425w6d by ultrasound admitted for active labor  Subjective:   Objective: BP 127/64 mmHg  Pulse 106  Temp(Src) 98 F (36.7 C) (Oral)  Resp 20  Ht 6' (1.829 m)  Wt 277 lb (125.646 kg)  BMI 37.56 kg/m2  SpO2 99%  LMP 08/20/2013      FHT:  FHR: 150 bpm, variability: moderate,  accelerations:  Present,  decelerations:  Absent UC:   regular, every 2-3 minutes SVE:   Dilation: 7 Effacement (%): 90 Station: -1 Exam by:: S. Oklesh  Labs: Lab Results  Component Value Date   WBC 8.7 06/13/2014   HGB 12.0 06/13/2014   HCT 34.5* 06/13/2014   MCV 79.9 06/13/2014   PLT 171 06/13/2014    Assessment / Plan: Augmentation of labor, progressing well  Labor: Progressing normally Preeclampsia:  no signs or symptoms of toxicity Fetal Wellbeing:  Category I Pain Control:  Epidural I/D:  n/a Anticipated MOD:  NSVD  Laurie Hooper Laurie Hooper 06/13/2014, 10:07 PM

## 2014-06-13 NOTE — H&P (Signed)
Laurie Hooper is a 24 y.o. female presenting for onset of labor. History  Laurie Hooper is a 24 y.o. G1P0 at [redacted]w[redacted]d by 8 week ultrasound who presents with complaint of contractions that started around 7 AM. Contractions are 4-5 minutes apart and moderate in intensity. Intensity has been increasing over the last few hours. No leakage of fluid or vaginal bleeding. Continues to feel regular fetal movement. Last cervical check in the office 3.5 cm/50%.  Pregnancy has been complicated by subchorionic hemorrhage on ultrasound at 20 weeks without bleeding that resolved on repeat at 24 weeks and multiple placental lakes on 1st trimester ultrasound. Serial ultrasounds have showed normal fetal growth and stable placental lakes. Placenta is anterior with NO evidence of previa. EFW on 4/29 of 6 lb 4 oz, normal growth percentile.    OB History    Gravida Para Term Preterm AB TAB SAB Ectopic Multiple Living   1              [facility]  FOB Johncie Wilson  Dating By 1st trimester [place]  Pap 11/04/2013 ASCUS w/ -HRHPV  GC/CT Initial:    neg  POC:            36+wks:  Genetic Screen NT/IT:   CF screen neg  Anatomic [place]  normal anatomy  Flu vaccine @ work  Tdap Recommended ~ 28wks  Glucose Screen  2 hr 102, 177, 146  GBS Neg  Feed Preference Breast  Contraception Undecided  Circumcision n/a  Childbirth Classes   Pediatrician     Past Medical History  Diagnosis Date  . Medical history non-contributory    Past Surgical History  Procedure Laterality Date  . Acl restoration     Family History: family history includes Diabetes in her paternal grandmother; Kidney disease in her father; Thyroid disease in her mother. Social History:  reports that she has quit smoking. She has never used smokeless tobacco. She reports that she does not drink alcohol or use illicit drugs.   Prenatal Transfer Tool  Maternal Diabetes: No Genetic Screening: Normal Maternal Ultrasounds/Referrals:  Normal Fetal Ultrasounds or other Referrals:  None Maternal Substance Abuse:  No Significant Maternal Medications:  None Significant Maternal Lab Results:  Lab values include: Group B Strep negative Other Comments:  None  Review of Systems  Constitutional: Negative for fever.  Eyes: Negative for blurred vision.  Cardiovascular: Negative for chest pain.  Gastrointestinal:       Abdominal pain with contractions  Neurological: Negative for headaches.    Dilation: 4 Effacement (%): 80 Station: -1 Exam by:: RZhang, RN Blood pressure 129/82, pulse 97, temperature 97.7 F (36.5 C), temperature source Oral, resp. rate 20, height 6' (1.829 m), weight 125.646 kg (277 lb), last menstrual period 08/20/2013. Exam Physical Exam  Constitutional: She appears well-developed and well-nourished.  HENT:  Head: Atraumatic.  Cardiovascular: Normal rate, regular rhythm and normal heart sounds.   Respiratory: Effort normal and breath sounds normal.  Genitourinary:  External genitalia normal. SVE exam as above.     Prenatal labs: ABO, Rh: O/POS/-- (10/28 1627) Antibody: Negative (03/10 0915) Rubella: 1.42 (10/28 1627) RPR: Non Reactive (03/10 0915)  HBsAg: NEGATIVE (10/28 1627)  HIV: NONREACTIVE (10/28 1627)  GBS: Negative (05/06 0000)   Assessment/Plan: A: 24 y.o. G1P0 at [redacted]w[redacted]d presenting in active labor. GBS negative.   P:  Admit to BS Plan for AROM May have epidural  Clear liquid diet No DVT prophylaxis MIVF with LR   Patient history,  exam, assessment and plan discussed with Zerita Boersarlene Lawson, CNM  Fabio AsaMonica T Lawler 06/13/2014, 10:35 AM

## 2014-06-13 NOTE — Progress Notes (Signed)
Patient ID: Laurie Hooper, female   DOB: 11/24/1990, 24 y.o.   MRN: [id number]  S:  Patient reporting increased contractions  O:  CTX q2-3 minutes FHT Cat I  SVE: 6 cm, 90%, -1. Head well applied SROM with small amount of clear fluid  A/P:  Spontaneous labor progressing well Patient to get epidural Monitor closely  Expectant mangement

## 2014-06-13 NOTE — MAU Note (Signed)
Per JCox, RN charge, pt to go to room 164.

## 2014-06-14 ENCOUNTER — Inpatient Hospital Stay (HOSPITAL_COMMUNITY): Admission: RE | Admit: 2014-06-14 | Payer: BLUE CROSS/BLUE SHIELD | Source: Ambulatory Visit

## 2014-06-14 ENCOUNTER — Encounter (HOSPITAL_COMMUNITY): Payer: Self-pay

## 2014-06-14 DIAGNOSIS — O480 Post-term pregnancy: Secondary | ICD-10-CM

## 2014-06-14 DIAGNOSIS — Z3A40 40 weeks gestation of pregnancy: Secondary | ICD-10-CM

## 2014-06-14 LAB — RPR: RPR Ser Ql: NONREACTIVE

## 2014-06-14 MED ORDER — ZOLPIDEM TARTRATE 5 MG PO TABS: 5.0000 mg | ORAL_TABLET | Freq: Every evening | ORAL | Status: DC | PRN

## 2014-06-14 MED ORDER — ERYTHROMYCIN 5 MG/GM OP OINT
TOPICAL_OINTMENT | OPHTHALMIC | Status: AC
  Filled 2014-06-14: qty 1

## 2014-06-14 MED ORDER — PRENATAL MULTIVITAMIN CH
1.0000 | ORAL_TABLET | Freq: Every day | ORAL | Status: DC
  Administered 2014-06-14 – 2014-06-16 (×3): 1 via ORAL
  Filled 2014-06-14 (×3): qty 1

## 2014-06-14 MED ORDER — OXYCODONE-ACETAMINOPHEN 5-325 MG PO TABS
1.0000 | ORAL_TABLET | ORAL | Status: DC | PRN
  Administered 2014-06-14 – 2014-06-15 (×2): 1 via ORAL

## 2014-06-14 MED ORDER — SODIUM CHLORIDE 0.9 % IJ SOLN: 3.0000 mL | Freq: Two times a day (BID) | INTRAMUSCULAR | Status: DC

## 2014-06-14 MED ORDER — SENNOSIDES-DOCUSATE SODIUM 8.6-50 MG PO TABS
2.0000 | ORAL_TABLET | ORAL | Status: DC
  Administered 2014-06-15 (×2): 2 via ORAL
  Filled 2014-06-14 (×2): qty 2

## 2014-06-14 MED ORDER — ONDANSETRON HCL 4 MG/2ML IJ SOLN: 4.0000 mg | INTRAMUSCULAR | Status: DC | PRN

## 2014-06-14 MED ORDER — DIPHENHYDRAMINE HCL 25 MG PO CAPS: 25.0000 mg | ORAL_CAPSULE | Freq: Four times a day (QID) | ORAL | Status: DC | PRN

## 2014-06-14 MED ORDER — ACETAMINOPHEN 325 MG PO TABS: 650.0000 mg | ORAL_TABLET | ORAL | Status: DC | PRN

## 2014-06-14 MED ORDER — LANOLIN HYDROUS EX OINT: TOPICAL_OINTMENT | CUTANEOUS | Status: DC | PRN

## 2014-06-14 MED ORDER — ONDANSETRON HCL 4 MG PO TABS: 4.0000 mg | ORAL_TABLET | ORAL | Status: DC | PRN

## 2014-06-14 MED ORDER — TETANUS-DIPHTH-ACELL PERTUSSIS 5-2.5-18.5 LF-MCG/0.5 IM SUSP
0.5000 mL | Freq: Once | INTRAMUSCULAR | Status: AC
  Administered 2014-06-14: 0.5 mL via INTRAMUSCULAR

## 2014-06-14 MED ORDER — BENZOCAINE-MENTHOL 20-0.5 % EX AERO
1.0000 "application " | INHALATION_SPRAY | CUTANEOUS | Status: DC | PRN
  Administered 2014-06-14: 1 via TOPICAL
  Filled 2014-06-14: qty 56

## 2014-06-14 MED ORDER — WITCH HAZEL-GLYCERIN EX PADS: 1.0000 "application " | MEDICATED_PAD | CUTANEOUS | Status: DC | PRN

## 2014-06-14 MED ORDER — SODIUM CHLORIDE 0.9 % IJ SOLN: 3.0000 mL | INTRAMUSCULAR | Status: DC | PRN

## 2014-06-14 MED ORDER — DIBUCAINE 1 % RE OINT: 1.0000 "application " | TOPICAL_OINTMENT | RECTAL | Status: DC | PRN

## 2014-06-14 MED ORDER — SODIUM CHLORIDE 0.9 % IV SOLN: 250.0000 mL | INTRAVENOUS | Status: DC | PRN

## 2014-06-14 MED ORDER — OXYTOCIN 40 UNITS IN LACTATED RINGERS INFUSION - SIMPLE MED: 62.5000 mL/h | INTRAVENOUS | Status: DC | PRN

## 2014-06-14 MED ORDER — SIMETHICONE 80 MG PO CHEW: 80.0000 mg | CHEWABLE_TABLET | ORAL | Status: DC | PRN

## 2014-06-14 MED ORDER — OXYCODONE-ACETAMINOPHEN 5-325 MG PO TABS
2.0000 | ORAL_TABLET | ORAL | Status: DC | PRN
  Administered 2014-06-16: 2 via ORAL

## 2014-06-14 MED ORDER — IBUPROFEN 600 MG PO TABS
600.0000 mg | ORAL_TABLET | Freq: Four times a day (QID) | ORAL | Status: DC
  Administered 2014-06-14 – 2014-06-16 (×10): 600 mg via ORAL
  Filled 2014-06-14 (×10): qty 1

## 2014-06-14 NOTE — Lactation Note (Signed)
This note was copied from the chart of Laurie Hooper Azzarello. Lactation Consultation Note [facility] RN assisted in latching. I came later and mom stated baby BF well after she finally her her latched.  Mom has everted nipples w/stimulated of finger roll to nipples. Has had nipples pierced in past. Hand expression demonstrated w/noted colostrum.  Mom sitting up in bed holding baby to chest wrapped in blankets and baby sucking on pacifier. Pacifier use not recommended at this time d/t possible nipple confusion. Mom stated ok. Mom encouraged to feed baby 8-12 times/24 hours and with feeding cues. Mom encouraged to waken baby for feeds. Educated about newborn behavior. Mom encouraged to do skin-to-skin. Referred to Baby and Me Book in Breastfeeding section Pg. 22-23 for position options and Proper latch demonstration. WH/LC brochure given w/resources, support groups and LC services.  Discussed supply and demand, I&O and documenting feedings.  Patient Name: Laurie Hooper Hilbert EXBMW'UToday's Date: 06/14/2014 Reason for consult: Initial assessment   Maternal Data Has patient been taught Hand Expression?: Yes Does the patient have breastfeeding experience prior to this delivery?: No  Feeding Feeding Type: Breast Fed Length of feed: 3 min  LATCH Score/Interventions Latch: Grasps breast easily, tongue down, lips flanged, rhythmical sucking. Intervention(s): Adjust position;Assist with latch;Breast compression  Audible Swallowing: A few with stimulation Intervention(s): Skin to skin  Type of Nipple: Everted at rest and after stimulation  Comfort (Breast/Nipple): Soft / non-tender     Hold (Positioning): Assistance needed to correctly position infant at breast and maintain latch. Intervention(s): Position options;Skin to skin;Support Pillows;Breastfeeding basics reviewed  LATCH Score: 8  Lactation Tools Discussed/Used     Consult Status Consult Status:  Follow-up Date: 06/15/14 Follow-up type: In-patient    Charyl DancerCARVER, Emmie Frakes G 06/14/2014, 5:44 AM

## 2014-06-14 NOTE — Anesthesia Postprocedure Evaluation (Signed)
  Anesthesia Post-op Note  Patient: Laurie Hooper  Procedure(s) Performed: * No procedures listed *  Patient Location: Mother/Baby  Anesthesia Type:Epidural  Level of Consciousness: awake  Airway and Oxygen Therapy: Patient Spontanous Breathing  Post-op Pain: mild  Post-op Assessment: Patient's Cardiovascular Status Stable and Respiratory Function Stable  Post-op Vital Signs: stable  Last Vitals:  Filed Vitals:   06/14/14 0801  BP: 112/58  Pulse: 102  Temp: 36.9 C  Resp: 20    Complications: No apparent anesthesia complications

## 2014-06-14 NOTE — Lactation Note (Signed)
This note was copied from the chart of Laurie Michael LitterKhadejah Gass. Lactation Consultation Note  Mother burping baby.  Finished 30 min feeding. Mother denies problems or soreness. Discussed cluster feeding and provided mother with a hand pump. Grandmother put pacifier in baby's mouth.  Suggest waiting until 3 weeks to use and provided reasons. FOB agreed. Suggest she call to view next feeding.  Patient Name: Laurie Hooper ZOXWR'UToday's Date: 06/14/2014     Maternal Data    Feeding Feeding Type: Breast Fed Length of feed: 30 min  LATCH Score/Interventions                      Lactation Tools Discussed/Used     Consult Status      [name], [name] 06/14/2014, 8:28 PM

## 2014-06-15 NOTE — Progress Notes (Signed)
Post Partum Day 1  Subjective:  Laurie Hooper is a 24 y.o. G1P1001 9631w0d s/p NSVD.  No acute events overnight.  Pt denies problems with ambulating, voiding or po intake.  She denies nausea or vomiting.  Pain is well controlled.  She has had flatus. She has had bowel movement.  Lochia Small.  Plan for birth control is Nexplanon.  Method of Feeding: Breast  Objective: BP 138/62 mmHg  Pulse 105  Temp(Src) 98.4 F (36.9 C) (Oral)  Resp 20  Ht 6' (1.829 m)  Wt 277 lb (125.646 kg)  BMI 37.56 kg/m2  SpO2 99%  LMP 08/20/2013  Breastfeeding? Unknown  Physical Exam:  General: alert, cooperative and no distress Lochia:normal flow Chest: CTAB Heart: RRR no m/r/g Abdomen: +BS, soft, nontender, fundus firm DVT Evaluation: No evidence of DVT seen on physical exam. Extremities: no edema    Recent Labs  06/13/14 1110  HGB 12.0  HCT 34.5*   Assessment/Plan:  ASSESSMENT: Laurie AngelicaKhadejah C Atlas is a 24 y.o. G1P1001 4831w0d ppd #1 s/p NSVD doing well.   Plan for discharge tomorrow, Breastfeeding and Lactation consult  Contraception: Nexplanon Continue routine postpartum care   LOS: 2 days   Caryl AdaJazma Phelps, DO 06/15/2014, 7:27 AM PGY-1, [facility]  OB fellow attestation Post Partum Day 1 I have seen and examined this patient and agree with above documentation in the resident's note.   Laurie AngelicaKhadejah C Desantis is a 24 y.o. G1P1001 s/p NSVD.  Pt denies problems with ambulating, voiding or po intake. Pain is well controlled.  Plan for birth control is Nexplanon.  Method of Feeding: breast  PE:  BP 138/62 mmHg  Pulse 105  Temp(Src) 98.4 F (36.9 C) (Oral)  Resp 20  Ht 6' (1.829 m)  Wt 277 lb (125.646 kg)  BMI 37.56 kg/m2  SpO2 99%  LMP 08/20/2013  Breastfeeding? Unknown Fundus firm  Plan for discharge: PPD#2  William DaltonMcEachern, Leelynn Whetsel, MD 8:41 AM

## 2014-06-15 NOTE — Lactation Note (Signed)
This note was copied from the chart of Laurie Michael LitterKhadejah Craine. Lactation Consultation Note  Patient Name: Laurie Michael LitterKhadejah Denslow WUJWJ'XToday's Date: 06/15/2014   Baby 38 hours old. Mom states that nursing is going "ok." Offered to assist mom to latch baby, but mom declined. Mom asked about a nipple shield. Discussed some reasons for using a NS, and offered to assist mom and see if she needs to be fitted with a shield. Mom states that she will call out later for an LC. Enc mom to call out at next feeding.  Maternal Data    Feeding Feeding Type: Breast Fed  LATCH Score/Interventions Latch: Repeated attempts needed to sustain latch, nipple held in mouth throughout feeding, stimulation needed to elicit sucking reflex. Intervention(s): Adjust position;Assist with latch  Audible Swallowing: A few with stimulation Intervention(s): Skin to skin;Hand expression (unable to hand express on left breast) Intervention(s): Skin to skin;Hand expression  Type of Nipple: Flat Intervention(s): Reverse pressure;No intervention needed  Comfort (Breast/Nipple): Filling, red/small blisters or bruises, mild/mod discomfort (gel on right breast/mom is sore from feeding last night)  Problem noted: Mild/Moderate discomfort Interventions (Mild/moderate discomfort): Hand massage;Comfort gels  Hold (Positioning): Assistance needed to correctly position infant at breast and maintain latch. Intervention(s): Breastfeeding basics reviewed;Support Pillows;Position options (changed from football to cross cradle)  LATCH Score: 5  Lactation Tools Discussed/Used     Consult Status      Laurie Hooper, Laurie Hooper 06/15/2014, 3:01 PM

## 2014-06-16 MED ORDER — IBUPROFEN 600 MG PO TABS: 600.0000 mg | ORAL_TABLET | Freq: Four times a day (QID) | ORAL | Status: DC

## 2014-06-16 NOTE — Lactation Note (Signed)
This note was copied from the chart of Laurie Hooper Otterness. Lactation Consultation Note  Patient Name: Laurie Hooper Rester WUJWJ'XToday's Date: 06/16/2014 Reason for consult: Follow-up assessment;Difficult latch;Infant weight loss Mom called for LC to observe latch. Mom latching baby to right breast, LC demonstrated to Mom how to obtain more depth with latch and to untuck lips for wider gape and deeper latch. Baby acts very hungry, chewing motions noted with suckling bursts. Did not hear audible swallows on the right breast. Baby appears to have short upper lip labial frenulum. Mom reports baby has been cluster feeding and she is concerned. Mom reports she has some difficulty latching baby to left breast, however at this visit Mom latched baby to left breast independently.  Attempted to hand express, some glistening of colostrum received from the left breast, none from the right breast.  Mom reports she would like to supplement till her milk comes in to see if baby would be more satisfied. Discussed ways to supplement without using a bottle, Mom interested in SNS. Baby had fallen asleep when LC came back to initiate SNS, Mom to call when baby awakens.   Maternal Data    Feeding Feeding Type: Formula Length of feed: 15 min  LATCH Score/Interventions Latch: Repeated attempts needed to sustain latch, nipple held in mouth throughout feeding, stimulation needed to elicit sucking reflex. (using SNS at breast) Intervention(s): Adjust position;Assist with latch  Audible Swallowing: Spontaneous and intermittent (w/SNS at breast)  Type of Nipple: Everted at rest and after stimulation (left nipple short shaft/aerola edema) Intervention(s): Hand pump  Comfort (Breast/Nipple): Filling, red/small blisters or bruises, mild/mod discomfort  Problem noted: Mild/Moderate discomfort Interventions (Mild/moderate discomfort): Comfort gels  Hold (Positioning): Assistance needed to correctly position infant at  breast and maintain latch. Intervention(s): Breastfeeding basics reviewed;Support Pillows;Position options;Skin to skin  LATCH Score: 7  Lactation Tools Discussed/Used Tools: Supplemental Nutrition System;Comfort gels   Consult Status Consult Status: Complete Date: 06/16/14 Follow-up type: In-patient    Alfred LevinsGranger, Ravenne Wayment Ann 06/16/2014, 3:55 PM

## 2014-06-16 NOTE — Discharge Summary (Signed)
Obstetric Discharge Summary Reason for Admission: onset of labor Prenatal Procedures: none Intrapartum Procedures: spontaneous vaginal delivery Postpartum Procedures: none Complications-Operative and Postpartum: right labial laceratin repaired with 3.0 vicryl HEMOGLOBIN  Date Value Ref Range Status  06/13/2014 12.0 12.0 - 15.0 g/dL Final   HCT  Date Value Ref Range Status  06/13/2014 34.5* 36.0 - 46.0 % Final   Laurie Hooper is a 24 y.o. G1P1001 after NSVD at 2645w0d. She presented in active labor. During labor, she had several hours of no cervical change so was started on pitocin for augmentation. Labor then progressed and she delivered viable female infant. At time of discharge, she is doing well. Breastfeeding well. Urinating without difficutly. + BM prior to discharge. She plans for Nexplanon for birth control and will call to have this placed after discharge.   Physical Exam:  Filed Vitals:   06/16/14 0516  BP: 137/80  Pulse: 101  Temp: 98.1 F (36.7 C)  Resp: 20    General: alert, cooperative and no distress Lochia: appropriate Uterine Fundus: firm DVT Evaluation: No cords or calf tenderness. No significant calf/ankle edema.  Discharge Diagnoses: Term Pregnancy-delivered  Discharge Information: Date: 06/16/2014 Activity: pelvic rest Diet: routine Medications: Ibuprofen Condition: stable Instructions: refer to practice specific booklet Discharge to: home Follow-up Information    [facility] an [facility] as [facility] as [facility]   Specialty:  Obstetrics and Gynecology   Why:  as soon as possible for nexplanon. in 4-6 weeks for postpartum check   Contact information:   [address] [place] [phone]      Newborn Data: Live born female  Birth Weight: 8 lb 7.2 oz (3832 g) APGAR: 8, 9  Home with mother.  Laurie Hooper 06/16/2014, 7:39 AM   I was present for the exam and agree with  above.  Laurie Hooper, [place] 06/17/2014 8:18 AM

## 2014-06-16 NOTE — Lactation Note (Signed)
This note was copied from the chart of Laurie Hooper Tippins. Lactation Consultation Note  Patient Name: Laurie Hooper [place]ton WUJWJ'XToday's Date: 06/16/2014 Reason for consult: Follow-up assessment Baby is at the breast in side lying position but non-nutritive suckling observed due to baby has been nursing for about the past hour between both breasts. Baby is now sleepy. Discussed with Mom that Dr. Janee Mornhompson would like LC to observe feeding before d/c and have good feeding plan. Discussed 9% weight loss, I/O. Baby has had 9 voids in life of 58 hours with 4 of those in the past 24 hours, last at 2340 on 6/7. Baby has had 5 stools in her life, the last 24 hours ago. [facility] with [facility] next feeding when baby is awake. LC left phone number for Mom to call.   Maternal Data    Feeding Feeding Type: Breast Fed Length of feed: 25 min  LATCH Score/Interventions Latch: Grasps breast easily, tongue down, lips flanged, rhythmical sucking.  Audible Swallowing: A few with stimulation Intervention(s): Skin to skin  Type of Nipple: Everted at rest and after stimulation  Comfort (Breast/Nipple): Soft / non-tender     Hold (Positioning): Assistance needed to correctly position infant at breast and maintain latch.  LATCH Score: 8  Lactation Tools Discussed/Used     Consult Status Consult Status: Follow-up Date: 06/16/14 Follow-up type: In-patient    Alfred LevinsGranger, Neftali Thurow Ann 06/16/2014, 11:04 AM

## 2014-06-16 NOTE — Discharge Instructions (Signed)
Vaginal Delivery, Care After °Refer to this sheet in the next few weeks. These discharge instructions provide you with information on caring for yourself after delivery. Your caregiver may also give you specific instructions. Your treatment has been planned according to the most current medical practices available, but problems sometimes occur. Call your caregiver if you have any problems or questions after you go home. °HOME CARE INSTRUCTIONS °· Take over-the-counter or prescription medicines only as directed by your caregiver or pharmacist. °· Do not drink alcohol, especially if you are breastfeeding or taking medicine to relieve pain. °· Do not chew or smoke tobacco. °· Do not use illegal drugs. °· Continue to use good perineal care. Good perineal care includes: °· Wiping your perineum from front to back. °· Keeping your perineum clean. °· Do not use tampons or douche until your caregiver says it is okay. °· Shower, wash your hair, and take tub baths as directed by your caregiver. °· Wear a well-fitting bra that provides breast support. °· Eat healthy foods. °· Drink enough fluids to keep your urine clear or pale yellow. °· Eat high-fiber foods such as whole grain cereals and breads, brown rice, beans, and fresh fruits and vegetables every day. These foods may help prevent or relieve constipation. °· Follow your caregiver's recommendations regarding resumption of activities such as climbing stairs, driving, lifting, exercising, or traveling. °· Talk to your caregiver about resuming sexual activities. Resumption of sexual activities is dependent upon your risk of infection, your rate of healing, and your comfort and desire to resume sexual activity. °· Try to have someone help you with your household activities and your newborn for at least a few days after you leave the hospital. °· Rest as much as possible. Try to rest or take a nap when your newborn is sleeping. °· Increase your activities gradually. °· Keep  all of your scheduled postpartum appointments. It is very important to keep your scheduled follow-up appointments. At these appointments, your caregiver will be checking to make sure that you are healing physically and emotionally. °SEEK MEDICAL CARE IF:  °· You are passing large clots from your vagina. Save any clots to show your caregiver. °· You have a foul smelling discharge from your vagina. °· You have trouble urinating. °· You are urinating frequently. °· You have pain when you urinate. °· You have a change in your bowel movements. °· You have increasing redness, pain, or swelling near your vaginal incision (episiotomy) or vaginal tear. °· You have pus draining from your episiotomy or vaginal tear. °· Your episiotomy or vaginal tear is separating. °· You have painful, hard, or reddened breasts. °· You have a severe headache. °· You have blurred vision or see spots. °· You feel sad or depressed. °· You have thoughts of hurting yourself or your newborn. °· You have questions about your care, the care of your newborn, or medicines. °· You are dizzy or light-headed. °· You have a rash. °· You have nausea or vomiting. °· You were breastfeeding and have not had a menstrual period within 12 weeks after you stopped breastfeeding. °· You are not breastfeeding and have not had a menstrual period by the 12th week after delivery. °· You have a fever. °SEEK IMMEDIATE MEDICAL CARE IF:  °· You have persistent pain. °· You have chest pain. °· You have shortness of breath. °· You faint. °· You have leg pain. °· You have stomach pain. °· Your vaginal bleeding saturates two or more sanitary pads   in 1 hour. °MAKE SURE YOU:  °· Understand these instructions. °· Will watch your condition. °· Will get help right away if you are not doing well or get worse. °Document Released: 12/23/1999 Document Revised: 05/11/2013 Document Reviewed: 08/22/2011 °[employer]® Patient Information ©2015 [employer]. This information is not intended to  replace advice given to you by your health care provider. Make sure you discuss any questions you have with your health care provider. ° °Breastfeeding °Deciding to breastfeed is one of the best choices you can make for you and your baby. A change in hormones during pregnancy causes your breast tissue to grow and increases the number and size of your milk ducts. These hormones also allow proteins, sugars, and fats from your blood supply to make breast milk in your milk-producing glands. Hormones prevent breast milk from being released before your baby is born as well as prompt milk flow after birth. Once breastfeeding has begun, thoughts of your baby, as well as his or her sucking or crying, can stimulate the release of milk from your milk-producing glands.  °BENEFITS OF BREASTFEEDING °For Your Baby °· Your first milk (colostrum) helps your baby's digestive system function better.   °· There are antibodies in your milk that help your baby fight off infections.   °· Your baby has a lower incidence of asthma, allergies, and sudden infant death syndrome.   °· The nutrients in breast milk are better for your baby than infant formulas and are designed uniquely for your baby's needs.   °· Breast milk improves your baby's brain development.   °· Your baby is less likely to develop other conditions, such as childhood obesity, asthma, or type 2 diabetes mellitus.   °For You  °· Breastfeeding helps to create a very special bond between you and your baby.   °· Breastfeeding is convenient. Breast milk is always available at the correct temperature and costs nothing.   °· Breastfeeding helps to burn calories and helps you lose the weight gained during pregnancy.   °· Breastfeeding makes your uterus contract to its prepregnancy size faster and slows bleeding (lochia) after you give birth.   °· Breastfeeding helps to lower your risk of developing type 2 diabetes mellitus, osteoporosis, and breast or ovarian cancer later in  life. °SIGNS THAT YOUR BABY IS HUNGRY °Early Signs of Hunger  °· Increased alertness or activity. °· Stretching. °· Movement of the head from side to side. °· Movement of the head and opening of the mouth when the corner of the mouth or cheek is stroked (rooting). °· Increased sucking sounds, smacking lips, cooing, sighing, or squeaking. °· Hand-to-mouth movements. °· Increased sucking of fingers or hands. °Late Signs of Hunger °· Fussing. °· Intermittent crying. °Extreme Signs of Hunger °Signs of extreme hunger will require calming and consoling before your baby will be able to breastfeed successfully. Do not wait for the following signs of extreme hunger to occur before you initiate breastfeeding:   °· Restlessness. °· A loud, strong cry. °·  Screaming. °BREASTFEEDING BASICS °Breastfeeding Initiation °· Find a comfortable place to sit or lie down, with your neck and back well supported. °· Place a pillow or rolled up blanket under your baby to bring him or her to the level of your breast (if you are seated). Nursing pillows are specially designed to help support your arms and your baby while you breastfeed. °· Make sure that your baby's abdomen is facing your abdomen.   °· Gently massage your breast. With your fingertips, massage from your chest wall toward your nipple in a   circular motion. This encourages milk flow. You may need to continue this action during the feeding if your milk flows slowly. °· Support your breast with 4 fingers underneath and your thumb above your nipple. Make sure your fingers are well away from your nipple and your baby's mouth.   °· Stroke your baby's lips gently with your finger or nipple.   °· When your baby's mouth is open wide enough, quickly bring your baby to your breast, placing your entire nipple and as much of the colored area around your nipple (areola) as possible into your baby's mouth.   °¨ More areola should be visible above your baby's upper lip than below the lower lip.    °¨ Your baby's tongue should be between his or her lower gum and your breast.   °· Ensure that your baby's mouth is correctly positioned around your nipple (latched). Your baby's lips should create a seal on your breast and be turned out (everted). °· It is common for your baby to suck about 2-3 minutes in order to start the flow of breast milk. °Latching °Teaching your baby how to latch on to your breast properly is very important. An improper latch can cause nipple pain and decreased milk supply for you and poor weight gain in your baby. Also, if your baby is not latched onto your nipple properly, he or she may swallow some air during feeding. This can make your baby fussy. Burping your baby when you switch breasts during the feeding can help to get rid of the air. However, teaching your baby to latch on properly is still the best way to prevent fussiness from swallowing air while breastfeeding. °Signs that your baby has successfully latched on to your nipple:    °· Silent tugging or silent sucking, without causing you pain.   °· Swallowing heard between every 3-4 sucks.   °·  Muscle movement above and in front of his or her ears while sucking.   °Signs that your baby has not successfully latched on to nipple:  °· Sucking sounds or smacking sounds from your baby while breastfeeding. °· Nipple pain. °If you think your baby has not latched on correctly, slip your finger into the corner of your baby's mouth to break the suction and place it between your baby's gums. Attempt breastfeeding initiation again. °Signs of Successful Breastfeeding °Signs from your baby:   °· A gradual decrease in the number of sucks or complete cessation of sucking.   °· Falling asleep.   °· Relaxation of his or her body.   °· Retention of a small amount of milk in his or her mouth.   °· Letting go of your breast by himself or herself. °Signs from you: °· Breasts that have increased in firmness, weight, and size 1-3 hours after feeding.    °· Breasts that are softer immediately after breastfeeding. °· Increased milk volume, as well as a change in milk consistency and color by the fifth day of breastfeeding.   °· Nipples that are not sore, cracked, or bleeding. ° °Signs That Your Baby is Getting Enough Milk °· Wetting at least 3 diapers in a 24-hour period. The urine should be clear and pale yellow by age 5 days. °· At least 3 stools in a 24-hour period by age 5 days. The stool should be soft and yellow. °· At least 3 stools in a 24-hour period by age 7 days. The stool should be seedy and yellow. °· No loss of weight greater than 10% of birth weight during the first 3 days of age. °· Average   weight gain of 4-7 ounces (113-198 g) per week after age 4 days. °· Consistent daily weight gain by age 5 days, without weight loss after the age of 2 weeks. °After a feeding, your baby may spit up a small amount. This is common. °BREASTFEEDING FREQUENCY AND DURATION °Frequent feeding will help you make more milk and can prevent sore nipples and breast engorgement. Breastfeed when you feel the need to reduce the fullness of your breasts or when your baby shows signs of hunger. This is called "breastfeeding on demand." Avoid introducing a pacifier to your baby while you are working to establish breastfeeding (the first 4-6 weeks after your baby is born). After this time you may choose to use a pacifier. Research has shown that pacifier use during the first year of a baby's life decreases the risk of sudden infant death syndrome (SIDS). °Allow your baby to feed on each breast as long as he or she wants. Breastfeed until your baby is finished feeding. When your baby unlatches or falls asleep while feeding from the first breast, offer the second breast. Because newborns are often sleepy in the first few weeks of life, you may need to awaken your baby to get him or her to feed. °Breastfeeding times will vary from baby to baby. However, the following rules can serve as  a guide to help you ensure that your baby is properly fed: °· Newborns (babies 4 weeks of age or younger) may breastfeed every 1-3 hours. °· Newborns should not go longer than 3 hours during the day or 5 hours during the night without breastfeeding. °· You should breastfeed your baby a minimum of 8 times in a 24-hour period until you begin to introduce solid foods to your baby at around 6 months of age. °BREAST MILK PUMPING °Pumping and storing breast milk allows you to ensure that your baby is exclusively fed your breast milk, even at times when you are unable to breastfeed. This is especially important if you are going back to work while you are still breastfeeding or when you are not able to be present during feedings. Your lactation consultant can give you guidelines on how long it is safe to store breast milk.  °A breast pump is a machine that allows you to pump milk from your breast into a sterile bottle. The pumped breast milk can then be stored in a refrigerator or freezer. Some breast pumps are operated by hand, while others use electricity. Ask your lactation consultant which type will work best for you. Breast pumps can be purchased, but some hospitals and breastfeeding support groups lease breast pumps on a monthly basis. A lactation consultant can teach you how to hand express breast milk, if you prefer not to use a pump.  °CARING FOR YOUR BREASTS WHILE YOU BREASTFEED °Nipples can become dry, cracked, and sore while breastfeeding. The following recommendations can help keep your breasts moisturized and healthy: °· Avoid using soap on your nipples.   °· Wear a supportive bra. Although not required, special nursing bras and tank tops are designed to allow access to your breasts for breastfeeding without taking off your entire bra or top. Avoid wearing underwire-style bras or extremely tight bras. °· Air dry your nipples for 3-4 minutes after each feeding.   °· Use only cotton bra pads to absorb leaked  breast milk. Leaking of breast milk between feedings is normal.   °· Use lanolin on your nipples after breastfeeding. Lanolin helps to maintain your skin's normal moisture barrier. If   you use pure lanolin, you do not need to wash it off before feeding your baby again. Pure lanolin is not toxic to your baby. You may also hand express a few drops of breast milk and gently massage that milk into your nipples and allow the milk to air dry. °In the first few weeks after giving birth, some women experience extremely full breasts (engorgement). Engorgement can make your breasts feel heavy, warm, and tender to the touch. Engorgement peaks within 3-5 days after you give birth. The following recommendations can help ease engorgement: °· Completely empty your breasts while breastfeeding or pumping. You may want to start by applying warm, moist heat (in the shower or with warm water-soaked hand towels) just before feeding or pumping. This increases circulation and helps the milk flow. If your baby does not completely empty your breasts while breastfeeding, pump any extra milk after he or she is finished. °· Wear a snug bra (nursing or regular) or tank top for 1-2 days to signal your body to slightly decrease milk production. °· Apply ice packs to your breasts, unless this is too uncomfortable for you. °· Make sure that your baby is latched on and positioned properly while breastfeeding. °If engorgement persists after 48 hours of following these recommendations, contact your health care provider or a lactation consultant. °OVERALL HEALTH CARE RECOMMENDATIONS WHILE BREASTFEEDING °· Eat healthy foods. Alternate between meals and snacks, eating 3 of each per day. Because what you eat affects your breast milk, some of the foods may make your baby more irritable than usual. Avoid eating these foods if you are sure that they are negatively affecting your baby. °· Drink milk, fruit juice, and water to satisfy your thirst (about 10  glasses a day).   °· Rest often, relax, and continue to take your prenatal vitamins to prevent fatigue, stress, and anemia. °· Continue breast self-awareness checks. °· Avoid chewing and smoking tobacco. °· Avoid alcohol and drug use. °Some medicines that may be harmful to your baby can pass through breast milk. It is important to ask your health care provider before taking any medicine, including all over-the-counter and prescription medicine as well as vitamin and herbal supplements. °It is possible to become pregnant while breastfeeding. If birth control is desired, ask your health care provider about options that will be safe for your baby. °SEEK MEDICAL CARE IF:  °· You feel like you want to stop breastfeeding or have become frustrated with breastfeeding. °· You have painful breasts or nipples. °· Your nipples are cracked or bleeding. °· Your breasts are red, tender, or warm. °· You have a swollen area on either breast. °· You have a fever or chills. °· You have nausea or vomiting. °· You have drainage other than breast milk from your nipples. °· Your breasts do not become full before feedings by the fifth day after you give birth. °· You feel sad and depressed. °· Your baby is too sleepy to eat well. °· Your baby is having trouble sleeping.   °· Your baby is wetting less than 3 diapers in a 24-hour period. °· Your baby has less than 3 stools in a 24-hour period. °· Your baby's skin or the white part of his or her eyes becomes yellow.   °· Your baby is not gaining weight by 5 days of age. °SEEK IMMEDIATE MEDICAL CARE IF:  °· Your baby is overly tired (lethargic) and does not want to wake up and feed. °· Your baby develops an unexplained fever. °Document   Released: 12/25/2004 Document Revised: 12/30/2012 Document Reviewed: 06/18/2012 °[employer]® Patient Information ©2015 [employer]. This information is not intended to replace advice given to you by your health care provider. Make sure you discuss any questions  you have with your health care provider. ° °

## 2014-06-16 NOTE — Lactation Note (Signed)
This note was copied from the chart of Laurie Hooper. Lactation Consultation Note  Patient Name: Laurie Hooper ZOXWR'UToday's Date: 06/16/2014 Reason for consult: Follow-up assessment;Difficult latch;Infant weight loss Mom called for LC to demonstrate SNS. Assisted Mom with latching baby to right breast using SNS. After few attempts baby developed a more nutritive suckling pattern and took 28 ml of Similac over 15 minutes. Mom reports she does not feel comfortable with the SNS and would prefer to use bottle to supplement. Advised to use bottle with valve system such as Dr. Manson PasseyBrown Preemie #1 to control flow of milk. Discussed risk of bottle use to BF success. Mom plans to obtain DEBP today to start post pumping to encourage milk production and to have EBM to supplement if needed.  Plan discussed with Mom: BF with each feeding 8-12 times or more in 24 hours and with feeding ques before giving any supplements Supplements according to guidelines given starting with 20 ml today. Post pump after feedings except at night for 15 minutes to encourage milk production and to have EBM to supplement. OP f/u scheduled for Wednesday, 06/23/14 at 0900. Peds f/u tomorrow for weight check.  Engorgement care reviewed if needed. Pump/storage guidelines reviewed, refer to page 25 Baby N Me booklet Dr. Janee Mornhompson called with report and agrees with plan. OK for d/c home.   Maternal Data    Feeding Feeding Type: Formula Length of feed: 15 min  LATCH Score/Interventions Latch: Repeated attempts needed to sustain latch, nipple held in mouth throughout feeding, stimulation needed to elicit sucking reflex. (using SNS at breast) Intervention(s): Adjust position;Assist with latch  Audible Swallowing: Spontaneous and intermittent (w/SNS at breast)  Type of Nipple: Everted at rest and after stimulation (left nipple short shaft/aerola edema) Intervention(s): Hand pump  Comfort (Breast/Nipple): Filling,  red/small blisters or bruises, mild/mod discomfort  Problem noted: Mild/Moderate discomfort Interventions (Mild/moderate discomfort): Comfort gels  Hold (Positioning): Assistance needed to correctly position infant at breast and maintain latch. Intervention(s): Breastfeeding basics reviewed;Support Pillows;Position options;Skin to skin  LATCH Score: 7  Lactation Tools Discussed/Used Tools: Supplemental Nutrition System;Comfort gels   Consult Status Consult Status: Complete Date: 06/16/14 Follow-up type: In-patient    Alfred LevinsGranger, Eathan Groman Ann 06/16/2014, 4:05 PM

## 2014-06-18 ENCOUNTER — Ambulatory Visit (HOSPITAL_COMMUNITY)
Admission: RE | Admit: 2014-06-18 | Discharge: 2014-06-18 | Disposition: A | Discharge status: Home / Self Care | Payer: BLUE CROSS/BLUE SHIELD | Source: Ambulatory Visit | Attending: Obstetrics and Gynecology | Admitting: Obstetrics and Gynecology

## 2014-06-23 ENCOUNTER — Ambulatory Visit (HOSPITAL_COMMUNITY): Payer: BLUE CROSS/BLUE SHIELD

## 2014-07-09 ENCOUNTER — Encounter: Payer: Self-pay | Admitting: Obstetrics and Gynecology

## 2014-07-09 ENCOUNTER — Ambulatory Visit: Payer: BLUE CROSS/BLUE SHIELD | Admitting: Obstetrics and Gynecology

## 2014-07-14 ENCOUNTER — Encounter: Payer: Self-pay | Admitting: Obstetrics and Gynecology

## 2014-07-14 ENCOUNTER — Ambulatory Visit (INDEPENDENT_AMBULATORY_CARE_PROVIDER_SITE_OTHER): Payer: BLUE CROSS/BLUE SHIELD | Admitting: Obstetrics and Gynecology

## 2014-07-14 MED ORDER — NORGESTIMATE-ETH ESTRADIOL 0.25-35 MG-MCG PO TABS: 1.0000 | ORAL_TABLET | Freq: Every day | ORAL | Status: DC

## 2014-07-14 NOTE — Progress Notes (Signed)
Patient ID: Laurie Hooper, female   DOB: 06/01/1990, 24 y.o.   MRN: [id number] Laurie AngelicaKhadejah C Trembath is a 24 y.o. who presents for a postpartum visit. She is 4 weeks postpartum following a spontaneous vaginal delivery. I have fully reviewed the prenatal and intrapartum course. The delivery was at 41w0 gestational weeks.  Anesthesia: epidural. Postpartum course has been normal. Baby's course has been uneventful. Baby is feeding by bottle. Bleeding: no bleeding. Bowel function is normal. Bladder function is normal. Patient is not sexually active. Contraception method is abstinence. Postpartum depression screening: negative.    @ROSSHORT @  Objective:     Filed Vitals:   07/14/14 1109  BP: 100/64   General:  alert, cooperative and no distress   Breasts:  negative  Lungs: clear to auscultation bilaterally  Heart:  regular rate and rhythm  Abdomen: Soft, nontender   Vulva:  normal  Vagina: normal vagina  Cervix:  closed  Corpus: Well involuted     Rectal Exam:  hemorrhoids        Assessment:    normal postpartum exam.  Plan:    1. Contraception: OCP (estrogen/progesterone) 2. Follow up in:12 1 months or as needed.

## 2014-09-28 ENCOUNTER — Ambulatory Visit: Payer: BLUE CROSS/BLUE SHIELD | Admitting: Advanced Practice Midwife

## 2014-09-28 ENCOUNTER — Encounter: Payer: Self-pay | Admitting: Advanced Practice Midwife

## 2014-10-13 ENCOUNTER — Ambulatory Visit: Payer: BLUE CROSS/BLUE SHIELD | Admitting: Advanced Practice Midwife

## 2015-02-04 ENCOUNTER — Ambulatory Visit: Payer: BLUE CROSS/BLUE SHIELD | Admitting: Urology

## 2015-08-14 ENCOUNTER — Encounter (HOSPITAL_COMMUNITY): Payer: Self-pay | Admitting: *Deleted

## 2015-08-14 ENCOUNTER — Ambulatory Visit (HOSPITAL_COMMUNITY)
Admission: EM | Admit: 2015-08-14 | Discharge: 2015-08-14 | Disposition: A | Discharge status: Home / Self Care | Payer: Managed Care, Other (non HMO) | Attending: Family Medicine | Admitting: Family Medicine

## 2015-08-14 DIAGNOSIS — R109 Unspecified abdominal pain: Secondary | ICD-10-CM | POA: Diagnosis present

## 2015-08-14 DIAGNOSIS — G43009 Migraine without aura, not intractable, without status migrainosus: Secondary | ICD-10-CM | POA: Insufficient documentation

## 2015-08-14 DIAGNOSIS — Z8349 Family history of other endocrine, nutritional and metabolic diseases: Secondary | ICD-10-CM | POA: Insufficient documentation

## 2015-08-14 DIAGNOSIS — Z841 Family history of disorders of kidney and ureter: Secondary | ICD-10-CM | POA: Diagnosis not present

## 2015-08-14 DIAGNOSIS — F17200 Nicotine dependence, unspecified, uncomplicated: Secondary | ICD-10-CM | POA: Insufficient documentation

## 2015-08-14 LAB — POCT URINALYSIS DIP (DEVICE)
Bilirubin Urine: NEGATIVE
GLUCOSE, UA: NEGATIVE mg/dL
HGB URINE DIPSTICK: NEGATIVE
KETONES UR: NEGATIVE mg/dL
Nitrite: NEGATIVE
Protein, ur: 100 mg/dL — AB
SPECIFIC GRAVITY, URINE: 1.015 (ref 1.005–1.030)
UROBILINOGEN UA: 4 mg/dL — AB (ref 0.0–1.0)
pH: 7.5 (ref 5.0–8.0)

## 2015-08-14 LAB — CBC
HCT: 39.8 % (ref 36.0–46.0)
Hemoglobin: 13.3 g/dL (ref 12.0–15.0)
MCH: 28.2 pg (ref 26.0–34.0)
MCHC: 33.4 g/dL (ref 30.0–36.0)
MCV: 84.5 fL (ref 78.0–100.0)
Platelets: 253 10*3/uL (ref 150–400)
RBC: 4.71 MIL/uL (ref 3.87–5.11)
RDW: 12.3 % (ref 11.5–15.5)
WBC: 7.7 10*3/uL (ref 4.0–10.5)

## 2015-08-14 LAB — COMPREHENSIVE METABOLIC PANEL
ALT: 18 U/L (ref 14–54)
AST: 18 U/L (ref 15–41)
Albumin: 4 g/dL (ref 3.5–5.0)
Alkaline Phosphatase: 53 U/L (ref 38–126)
Anion gap: 6 (ref 5–15)
BUN: 8 mg/dL (ref 6–20)
CALCIUM: 9.1 mg/dL (ref 8.9–10.3)
CO2: 24 mmol/L (ref 22–32)
CREATININE: 0.86 mg/dL (ref 0.44–1.00)
Chloride: 106 mmol/L (ref 101–111)
GFR calc Af Amer: >60 mL/min (ref >60)
Glucose, Bld: 100 mg/dL — ABNORMAL HIGH (ref 65–99)
Potassium: 3.8 mmol/L (ref 3.5–5.1)
Sodium: 136 mmol/L (ref 135–145)
Total Bilirubin: 0.9 mg/dL (ref 0.3–1.2)
Total Protein: 7 g/dL (ref 6.5–8.1)

## 2015-08-14 LAB — POCT PREGNANCY, URINE: Preg Test, Ur: NEGATIVE

## 2015-08-14 MED ORDER — METHYLPREDNISOLONE ACETATE 80 MG/ML IJ SUSP
INTRAMUSCULAR | Status: AC
  Filled 2015-08-14: qty 1

## 2015-08-14 MED ORDER — PROMETHAZINE HCL 25 MG PO TABS: 25.0000 mg | ORAL_TABLET | Freq: Four times a day (QID) | ORAL | 0 refills | Status: DC | PRN

## 2015-08-14 MED ORDER — KETOROLAC TROMETHAMINE 30 MG/ML IJ SOLN
30.0000 mg | Freq: Once | INTRAMUSCULAR | Status: AC
  Administered 2015-08-14: 30 mg via INTRAMUSCULAR

## 2015-08-14 MED ORDER — METHYLPREDNISOLONE ACETATE 80 MG/ML IJ SUSP
80.0000 mg | Freq: Once | INTRAMUSCULAR | Status: AC
  Administered 2015-08-14: 80 mg via INTRAMUSCULAR

## 2015-08-14 MED ORDER — KETOROLAC TROMETHAMINE 30 MG/ML IJ SOLN
INTRAMUSCULAR | Status: AC
  Filled 2015-08-14: qty 1

## 2015-08-14 NOTE — ED Provider Notes (Signed)
[facility]    CSN: [id number] Arrival date & time: 08/14/15  1715  First Provider Contact:  First MD Initiated Contact with Patient 08/14/15 1831     History   Chief Complaint Chief Complaint  Patient presents with  . Abdominal Pain  . Headache   HPI Laurie Hooper is a 25 y.o. female presenting for headache, abdominal pain, nausea, and vomiting.   She reports 7 days of worsening, intermittent intense throbbing on bilateral temples that usually lasts several hours. These are associated with nausea and vomiting as well as photophobia. No aura symptoms. Yesterday she attempted to go to work and the symptoms were severe enough to make her leave. Resting in a dark quiet area helps all symptoms. Ibuprofen offers little relief. She's also tried tylenol. LMP 7/30, sexually active without using contraception, though she does not desire pregnancy.   No fevers, chills, weight loss, +weight gain since pregnancy, + fatigue for several months. She's had increasing stress at her job and poor sleeping at times. No hearing changes, neck pain/stiffness, chest pain, palpitations, dyspnea, cough, diarrhea, constipation, dysuria, hematuria, myalgias, arthralgias, rash.   HPI  History reviewed. No pertinent past medical history.  Patient Active Problem List   Diagnosis Date Noted  . Labor and delivery indication for care or intervention 06/13/2014  . Subchorionic hematoma in second trimester 01/14/2014  . Placental abnormality in first trimester 01/14/2014  . Chlamydia infection affecting pregnancy in first trimester, antepartum 11/25/2013  . Supervision of normal pregnancy 11/04/2013   Past Surgical History:  Procedure Laterality Date  . KNEE ARTHROSCOPY W/ ACL RECONSTRUCTION     OB History    Gravida Para Term Preterm AB Living   SAB TAB Ectopic Multiple Live Births         0 1       Home Medications    Prior to Admission medications   Medication Sig  Start Date End Date Taking? Authorizing Provider  ibuprofen (ADVIL,MOTRIN) 600 MG tablet Take 1 tablet (600 mg total) by mouth every 6 (six) hours. 06/16/14  Yes Fabio Asa, MD  norgestimate-ethinyl estradiol (ORTHO-CYCLEN,SPRINTEC,PREVIFEM) 0.25-35 MG-MCG tablet Take 1 tablet by mouth daily. 07/14/14   Tilda Burrow, MD  promethazine (PHENERGAN) 25 MG tablet Take 1 tablet (25 mg total) by mouth every 6 (six) hours as needed for nausea or vomiting. 08/14/15   Tyrone Nine, MD    Family History Family History  Problem Relation Age of Onset  . Diabetes Paternal Grandmother   . Kidney disease Father   . Thyroid disease Mother     Social History Social History  Substance Use Topics  . Smoking status: Current Some Day Smoker  . Smokeless tobacco: Never Used  . Alcohol use No   Allergies   Review of patient's allergies indicates no known allergies.  Review of Systems Review of Systems As above  Physical Exam Triage Vital Signs ED Triage Vitals [08/14/15 1753]  Enc Vitals Group     BP 100/64     Pulse Rate 73     Resp      Temp 98.1 F (36.7 C)     Temp Source Oral     SpO2 96 %     Weight      Height      Head Circumference      Peak Flow      Pain Score 5     Pain  Loc      Pain Edu?      Excl. in GC?    No data found.   Updated Vital Signs BP 100/64 (BP Location: Left Arm)   Pulse 73   Temp 98.1 F (36.7 C) (Oral)   LMP 08/07/2015 (Exact Date)   SpO2 96%   Breastfeeding? No   Physical Exam  Constitutional: She is oriented to person, place, and time. She appears well-developed and well-nourished. No distress.  Eyes: EOM are normal. Pupils are equal, round, and reactive to light. No scleral icterus.  Neck: Neck supple. No JVD present.  Cardiovascular: Normal rate, regular rhythm, normal heart sounds and intact distal pulses.   No murmur heard. Pulmonary/Chest: Effort normal and breath sounds normal. No respiratory distress.  Abdominal: Soft. Bowel sounds  are normal. She exhibits no distension. There is tenderness (generalized R > L with neg Murphy's sign). There is no guarding.  Musculoskeletal: Normal range of motion. She exhibits no edema or tenderness.  Lymphadenopathy:    She has no cervical adenopathy.  Neurological: She is alert and oriented to person, place, and time. She exhibits normal muscle tone.  Skin: Skin is warm and dry.  Vitals reviewed.  UC Treatments / Results  Labs (all labs ordered are listed, but only abnormal results are displayed) Labs Reviewed  POCT URINALYSIS DIP (DEVICE) - Abnormal; Notable for the following:       Result Value   Protein, ur 100 (*)    Urobilinogen, UA 4.0 (*)    Leukocytes, UA TRACE (*)    All other components within normal limits  CBC  COMPREHENSIVE METABOLIC PANEL  POCT PREGNANCY, URINE   EKG  EKG Interpretation None      Radiology No results found.  Procedures Procedures (including critical care time)  Medications Ordered in UC Medications  ketorolac (TORADOL) 30 MG/ML injection 30 mg (30 mg Intramuscular Given 08/14/15 1910)  methylPREDNISolone acetate (DEPO-MEDROL) injection 80 mg (80 mg Intramuscular Given 08/14/15 1910)     Initial Impression / Assessment and Plan / UC Course  I have reviewed the triage vital signs and the nursing notes.  Pertinent labs & imaging results that were available during my care of the patient were reviewed by me and considered in my medical decision making (see chart for details).  Final Clinical Impressions(s) / UC Diagnoses   Final diagnoses:  Migraine without aura and without status migrainosus, not intractable   Well-appearing 25yo female presenting for severe headaches, N/V, photophobia most consistent with migraine. Likely triggered by increased stress at work and poor sleeping. Primary abdominal source of complaints is unlikely, as these were preceded by headache and she is afebrile without change in bowel habits, though tenderness to  palpation is not very consistent with migraine.  - Will check CBC and CMP to r/o infection and intra-abdominal pathology.  - Given toradol and decadron here with significant improvement.  - Rx phenergan prn nausea. Imitrex trial considered, but will not prescribe this with possibility of pregnancy in the near future (UPT neg today).  - F/u with PCP or OB/GYN for contraceptive counseling as she is sexually active without contraception despite not desiring pregnancy  New Prescriptions New Prescriptions   PROMETHAZINE (PHENERGAN) 25 MG TABLET    Take 1 tablet (25 mg total) by mouth every 6 (six) hours as needed for nausea or vomiting.     Tyrone Nineyan B Tiger Spieker, MD 08/14/15 [phone]

## 2015-08-14 NOTE — ED Triage Notes (Signed)
C/O nasal congestion with HA, intermittent hoarse voice (denies sore throat), intermittent abd cramping, diarrhea, nausea, vomiting since yesterday afternoon.  Able to keep down some PO fluids.  Has been taking IBU - last dose today @ approx 1200.

## 2015-08-22 ENCOUNTER — Ambulatory Visit: Payer: BLUE CROSS/BLUE SHIELD | Admitting: Women's Health

## 2015-10-07 ENCOUNTER — Ambulatory Visit: Payer: Managed Care, Other (non HMO) | Admitting: Podiatry

## 2016-03-26 ENCOUNTER — Encounter (HOSPITAL_COMMUNITY): Payer: Self-pay | Admitting: Emergency Medicine

## 2016-03-26 ENCOUNTER — Emergency Department (HOSPITAL_COMMUNITY): Payer: Managed Care, Other (non HMO)

## 2016-03-26 ENCOUNTER — Emergency Department (HOSPITAL_COMMUNITY)
Admission: EM | Admit: 2016-03-26 | Discharge: 2016-03-26 | Disposition: A | Discharge status: Home / Self Care | Payer: Managed Care, Other (non HMO) | Attending: Emergency Medicine | Admitting: Emergency Medicine

## 2016-03-26 DIAGNOSIS — M545 Low back pain, unspecified: Secondary | ICD-10-CM

## 2016-03-26 DIAGNOSIS — F17200 Nicotine dependence, unspecified, uncomplicated: Secondary | ICD-10-CM | POA: Diagnosis not present

## 2016-03-26 DIAGNOSIS — Z79899 Other long term (current) drug therapy: Secondary | ICD-10-CM | POA: Insufficient documentation

## 2016-03-26 LAB — I-STAT BETA HCG BLOOD, ED (MC, WL, AP ONLY): I-stat hCG, quantitative: <5 m[IU]/mL (ref <5)

## 2016-03-26 MED ORDER — METHOCARBAMOL 500 MG PO TABS: 500.0000 mg | ORAL_TABLET | Freq: Two times a day (BID) | ORAL | 0 refills | Status: DC | PRN

## 2016-03-26 MED ORDER — LIDOCAINE 5 % EX PTCH
1.0000 | MEDICATED_PATCH | CUTANEOUS | Status: DC
  Administered 2016-03-26: 1 via TRANSDERMAL
  Filled 2016-03-26 (×2): qty 1

## 2016-03-26 MED ORDER — HYDROCODONE-ACETAMINOPHEN 5-325 MG PO TABS: 1.0000 | ORAL_TABLET | Freq: Four times a day (QID) | ORAL | 0 refills | Status: DC | PRN

## 2016-03-26 MED ORDER — OXYCODONE-ACETAMINOPHEN 5-325 MG PO TABS
1.0000 | ORAL_TABLET | Freq: Once | ORAL | Status: AC
  Administered 2016-03-26: 1 via ORAL
  Filled 2016-03-26: qty 1

## 2016-03-26 MED ORDER — METHOCARBAMOL 500 MG PO TABS
500.0000 mg | ORAL_TABLET | Freq: Once | ORAL | Status: AC
  Administered 2016-03-26: 500 mg via ORAL
  Filled 2016-03-26: qty 1

## 2016-03-26 MED ORDER — NAPROXEN 500 MG PO TABS: 500.0000 mg | ORAL_TABLET | Freq: Two times a day (BID) | ORAL | 0 refills | Status: DC | PRN

## 2016-03-26 MED ORDER — KETOROLAC TROMETHAMINE 60 MG/2ML IM SOLN
30.0000 mg | Freq: Once | INTRAMUSCULAR | Status: AC
  Administered 2016-03-26: 30 mg via INTRAMUSCULAR
  Filled 2016-03-26: qty 2

## 2016-03-26 NOTE — ED Triage Notes (Signed)
Pt reports she began to have midline lower back pain while doing hair at 1300 today. No known hx of back issues. Denies any pain/numbness down legs.

## 2016-03-26 NOTE — ED Notes (Signed)
Per EMS, pt from home.  Pt c/o lumbar back pain. Worse with movement.  Pt states she was doing her hair standing up and all of a sudden, back pain started.  Vitals:  122/62, hr 84, 98% ra, resp 16

## 2016-03-26 NOTE — ED Notes (Signed)
Patient transported to X-ray 

## 2016-03-26 NOTE — Discharge Instructions (Signed)
It was my pleasure taking care of you today!   Naproxen as needed for mild to moderate pain. Norco only as needed for severe pain - This can make you very drowsy - please do not drink alcohol, operate heavy machinery or drive on this medication. Robaxin is your muscle relaxer to take as needed - please do not drink alcohol or drive on this medication either. In addition to this, ice the affected area for pain relief.   Your back pain should get better over the next 2 weeks. If your symptoms are not improving, please follow up with the spine center listed. If you develop severe or worsening pain, low back pain with fever, numbness, weakness or inability to walk or urinate, you should return to the ER immediately.   COLD THERAPY DIRECTIONS:  Ice or gel packs can be used to reduce both pain and swelling. Ice is the most helpful within the first 24 to 48 hours after an injury or flareup from overusing a muscle or joint.  Ice is effective, has very few side effects, and is safe for most people to use.   If you expose your skin to cold temperatures for too long or without the proper protection, you can damage your skin or nerves. Watch for signs of skin damage due to cold.   HOME CARE INSTRUCTIONS  Follow these tips to use ice and cold packs safely.  Place a dry or damp towel between the ice and skin. A damp towel will cool the skin more quickly, so you may need to shorten the time that the ice is used.  For a more rapid response, add gentle compression to the ice.  Ice for no more than 10 to 20 minutes at a time. The bonier the area you are icing, the less time it will take to get the benefits of ice.  Check your skin after 5 minutes to make sure there are no signs of a poor response to cold or skin damage.  Rest 20 minutes or more in between uses.  Once your skin is numb, you can end your treatment. You can test numbness by very lightly touching your skin. The touch should be so light that you do not  see the skin dimple from the pressure of your fingertip. When using ice, most people will feel these normal sensations in this order: cold, burning, aching, and numbness.   Be aware that if you develop new symptoms, such as a fever, leg weakness, difficulty with or loss of control of your urine or bowels, abdominal pain, or more severe pain, you will need to seek medical attention and  / or return to the Emergency department.

## 2016-03-26 NOTE — ED Notes (Signed)
RN contacted pharmacy due to no lidoderm patches stocked in ED Pyxis

## 2016-03-26 NOTE — ED Provider Notes (Signed)
[facility] Note   CSN: [id number] Arrival date & time: 03/26/16  1751     History   Chief Complaint Chief Complaint  Patient presents with  . Back Pain    HPI Laurie Hooper is a 26 y.o. female.  The history is provided by the patient and medical records. No language interpreter was used.   Laurie Hooper is a 26 y.o. female who presents to the Emergency Department complaining of progressively worsening low back pain for around a week with an acute worsening today. Patient states that she was standing up doing her hair when she felt a pop and had acute onset of right-sided aching, sharp low back pain which radiates across the low back. She took a tramadol at home with little relief in symptoms. Pain is worse with ambulation and movement. Denies upper back or neck pain, Denies fever, saddle anesthesia, weakness, numbness, tingling,  urinary complaints including retention/incontinence. No history of cancer, IVDU, or recent spinal procedures.    History reviewed. No pertinent past medical history.  Patient Active Problem List   Diagnosis Date Noted  . Labor and delivery indication for care or intervention 06/13/2014  . Subchorionic hematoma in second trimester 01/14/2014  . Placental abnormality in first trimester 01/14/2014  . Chlamydia infection affecting pregnancy in first trimester, antepartum 11/25/2013  . Supervision of normal pregnancy 11/04/2013    Past Surgical History:  Procedure Laterality Date  . KNEE ARTHROSCOPY W/ ACL RECONSTRUCTION      OB History    Gravida Para Term Preterm AB Living   1 1 1     1    SAB TAB Ectopic Multiple Live Births         0 1       Home Medications    Prior to Admission medications   Medication Sig Start Date End Date Taking? Authorizing Provider  HYDROcodone-acetaminophen (NORCO/VICODIN) 5-325 MG tablet Take 1 tablet by mouth every 6 (six) hours as needed. 03/26/16   Chase PicketJaime Pilcher Ward, PA-C    ibuprofen (ADVIL,MOTRIN) 600 MG tablet Take 1 tablet (600 mg total) by mouth every 6 (six) hours. 06/16/14   Fabio AsaMonica T Lawler, MD  methocarbamol (ROBAXIN) 500 MG tablet Take 1 tablet (500 mg total) by mouth 2 (two) times daily as needed for muscle spasms. 03/26/16   Chase PicketJaime Pilcher Ward, PA-C  naproxen (NAPROSYN) 500 MG tablet Take 1 tablet (500 mg total) by mouth 2 (two) times daily as needed. 03/26/16   Chase PicketJaime Pilcher Ward, PA-C  norgestimate-ethinyl estradiol (ORTHO-CYCLEN,SPRINTEC,PREVIFEM) 0.25-35 MG-MCG tablet Take 1 tablet by mouth daily. 07/14/14   Tilda BurrowJohn Ferguson V, MD  promethazine (PHENERGAN) 25 MG tablet Take 1 tablet (25 mg total) by mouth every 6 (six) hours as needed for nausea or vomiting. 08/14/15   Tyrone Nineyan B Grunz, MD    Family History Family History  Problem Relation Age of Onset  . Diabetes Paternal Grandmother   . Kidney disease Father   . Thyroid disease Mother     Social History Social History  Substance Use Topics  . Smoking status: Current Some Day Smoker  . Smokeless tobacco: Never Used  . Alcohol use No     Allergies   Patient has no known allergies.   Review of Systems Review of Systems  Constitutional: Negative for fever.  Gastrointestinal: Negative for abdominal pain, nausea and vomiting.  Musculoskeletal: Positive for back pain. Negative for neck pain.  Skin: Negative for color change.  Neurological: Negative for weakness and numbness.  Physical Exam Updated Vital Signs BP (!) 109/57 (BP Location: Left Arm)   Pulse 64   Temp 98.3 F (36.8 C) (Oral)   Resp 19   Ht 6\' 2"  (1.88 m)   Wt 117.9 kg   LMP 03/08/2016   SpO2 100%   BMI 33.38 kg/m   Physical Exam  Constitutional: She is oriented to person, place, and time. She appears well-developed and well-nourished.  Neck:  Full ROM without pain No midline tenderness No tenderness of paraspinal musculature  Cardiovascular: Normal rate, regular rhythm, normal heart sounds and intact distal pulses.   Exam reveals no gallop and no friction rub.   No murmur heard. Pulmonary/Chest: Effort normal and breath sounds normal. No respiratory distress. She has no wheezes. She has no rales.  Abdominal: Soft. Bowel sounds are normal. She exhibits no distension. There is no tenderness.  Musculoskeletal:       Arms: TTP as depicted in image (R>L). No noted deformities or signs of inflammation. No overlying skin changes. Curvature of cervical, thoracic, and lumbar spine within normal limits. Decreased ROM secondary to pain. Straight leg raises are negative on bilaterally for radicular symptoms. 5/5 muscle strength of bilateral LE's   Neurological: She is alert and oriented to person, place, and time. She has normal reflexes.  Bilateral lower extremities neurovascularly intact.  Skin: Skin is warm and dry. No rash noted. No erythema.  Nursing note and vitals reviewed.    ED Treatments / Results  Labs (all labs ordered are listed, but only abnormal results are displayed) Labs Reviewed  I-STAT BETA HCG BLOOD, ED ([facility], [facility], [facility] ONLY)    EKG  EKG Interpretation None       Radiology Dg Lumbar Spine Complete  Result Date: 03/26/2016 CLINICAL DATA:  26 year old female with back pain. EXAM: LUMBAR SPINE - COMPLETE 4+ VIEW COMPARISON:  None. FINDINGS: There is no evidence of lumbar spine fracture. Alignment is normal. Intervertebral disc spaces are maintained. IMPRESSION: Negative. Electronically Signed   By: Elgie Collard M.D.   On: 03/26/2016 22:06    Procedures Procedures (including critical care time)  Medications Ordered in ED Medications  lidocaine (LIDODERM) 5 % 1 patch (1 patch Transdermal Patch Applied 03/26/16 2003)  ketorolac (TORADOL) injection 30 mg (30 mg Intramuscular Given 03/26/16 2002)  methocarbamol (ROBAXIN) tablet 500 mg (500 mg Oral Given 03/26/16 2003)  oxyCODONE-acetaminophen (PERCOCET/ROXICET) 5-325 MG per tablet 1 tablet (1 tablet Oral Given 03/26/16 2141)     Initial  Impression / Assessment and Plan / ED Course  I have reviewed the triage vital signs and the nursing notes.  Pertinent labs & imaging results that were available during my care of the patient were reviewed by me and considered in my medical decision making (see chart for details).    Laurie Hooper presents with back pain. Patient demonstrates no lower extremity weakness, saddle anesthesia, bowel or bladder incontinence, or neuro deficits. No concern for cauda equina. No fevers or other infectious symptoms to suggest that the patient's back pain is due to an infection. Lower extremities are neurovascularly intact. Plain film L-spine reviewed and negative. Pain managed while in the ED.  I have reviewed return precautions, including the development of any of the above signs or symptoms, and the patient has voiced understanding. I reviewed supportive care instructions, including NSAIDs, early range of motion exercises. She has no PCP in the area - stressed importance of PCP follow up. Neurosurg follow up given if needed. [place] Controlled Substance Database  consulted with no active prescriptions. Rx for naproxen, robaxin and short course pain meds given. Patient ambulatory in ED.  Patient voiced understanding and agreement with plan. All questions answered.    Final Clinical Impressions(s) / ED Diagnoses   Final diagnoses:  Acute bilateral low back pain without sciatica    New Prescriptions New Prescriptions   HYDROCODONE-ACETAMINOPHEN (NORCO/VICODIN) 5-325 MG TABLET    Take 1 tablet by mouth every 6 (six) hours as needed.   METHOCARBAMOL (ROBAXIN) 500 MG TABLET    Take 1 tablet (500 mg total) by mouth 2 (two) times daily as needed for muscle spasms.   NAPROXEN (NAPROSYN) 500 MG TABLET    Take 1 tablet (500 mg total) by mouth 2 (two) times daily as needed.     [facility] Ward, PA-C 03/26/16 2226    Doug Sou, MD 03/26/16 2350

## 2016-09-27 ENCOUNTER — Encounter: Payer: Self-pay | Admitting: Family Medicine

## 2017-07-10 ENCOUNTER — Encounter: Payer: Self-pay | Admitting: Family Medicine

## 2017-07-10 ENCOUNTER — Other Ambulatory Visit (HOSPITAL_COMMUNITY)
Admission: RE | Admit: 2017-07-10 | Discharge: 2017-07-10 | Disposition: A | Discharge status: Home / Self Care | Payer: 59 | Source: Ambulatory Visit | Attending: Family Medicine | Admitting: Family Medicine

## 2017-07-10 ENCOUNTER — Ambulatory Visit (INDEPENDENT_AMBULATORY_CARE_PROVIDER_SITE_OTHER): Payer: 59 | Admitting: Family Medicine

## 2017-07-10 VITALS — BP 122/80 | HR 77 | Temp 98.4°F | Resp 12 | Ht 74.0 in | Wt 254.5 lb

## 2017-07-10 DIAGNOSIS — Z30011 Encounter for initial prescription of contraceptive pills: Secondary | ICD-10-CM

## 2017-07-10 DIAGNOSIS — Z124 Encounter for screening for malignant neoplasm of cervix: Secondary | ICD-10-CM

## 2017-07-10 DIAGNOSIS — N946 Dysmenorrhea, unspecified: Secondary | ICD-10-CM | POA: Diagnosis not present

## 2017-07-10 DIAGNOSIS — Z131 Encounter for screening for diabetes mellitus: Secondary | ICD-10-CM

## 2017-07-10 DIAGNOSIS — Z0000 Encounter for general adult medical examination without abnormal findings: Secondary | ICD-10-CM

## 2017-07-10 DIAGNOSIS — R739 Hyperglycemia, unspecified: Secondary | ICD-10-CM | POA: Diagnosis not present

## 2017-07-10 LAB — POCT GLYCOSYLATED HEMOGLOBIN (HGB A1C): HEMOGLOBIN A1C: 5.2 % (ref 4.0–5.6)

## 2017-07-10 LAB — POCT URINE PREGNANCY: PREG TEST UR: NEGATIVE

## 2017-07-10 MED ORDER — NORGESTIMATE-ETH ESTRADIOL 0.25-35 MG-MCG PO TABS: 1.0000 | ORAL_TABLET | Freq: Every day | ORAL | 3 refills | Status: DC

## 2017-07-10 NOTE — Progress Notes (Signed)
HPI:   LaurieLaurie Hooper is a 27 y.o. female, who is here today to establish care.   She is requesting CPE done today. PCP: N/A Last CPE: 2015  Regular exercise 3 or more time per week: Yes, 3 times per week she does cardio,yoga,and plays basketball. Following a healthy diet: Trying to do better with portions, there is room for improvement. She lives with daughter , mother,and MGM.  Chronic medical problems: Noncontributory.  Pap smear 2015. Hx of abnormal pap smears: Negative. Hx of STD's treated for chlamydia in 2015. LMP 06/21/2017. Cramps with menses. G: 1P: 1A: 0   She did not breast-feed. Sexually active, she is not on birth control. In the past she has been on OCP, she would like to start OCP again. She tolerated well.  Family history negative for gynecologic cancer.  Immunization History  Administered Date(s) Administered  . Tdap 06/14/2014    She has no concerns today.   Review of Systems  Constitutional: Negative for appetite change, fatigue, fever and unexpected weight change.  HENT: Negative for dental problem, hearing loss, nosebleeds, sore throat, trouble swallowing and voice change.   Eyes: Negative for redness and visual disturbance.  Respiratory: Negative for cough, shortness of breath and wheezing.   Cardiovascular: Negative for chest pain and leg swelling.  Gastrointestinal: Negative for abdominal pain, blood in stool, nausea and vomiting.       No changes in bowel habits.  Endocrine: Negative for cold intolerance, heat intolerance, polydipsia, polyphagia and polyuria.  Genitourinary: Positive for menstrual problem (cramps.). Negative for decreased urine volume, dysuria, hematuria, vaginal bleeding and vaginal discharge.       No breast tenderness or nipple discharge.  Musculoskeletal: Negative for arthralgias, gait problem and myalgias.  Skin: Negative for rash and wound.  Allergic/Immunologic: Negative for environmental allergies.    Neurological: Negative for syncope, weakness, numbness and headaches.  Hematological: Negative for adenopathy. Does not bruise/bleed easily.  Psychiatric/Behavioral: Negative for confusion and sleep disturbance. The patient is not nervous/anxious.   All other systems reviewed and are negative.     Current Outpatient Medications on File Prior to Visit  Medication Sig Dispense Refill  . HYDROcodone-acetaminophen (NORCO/VICODIN) 5-325 MG tablet Take 1 tablet by mouth every 6 (six) hours as needed. 10 tablet 0  . ibuprofen (ADVIL,MOTRIN) 600 MG tablet Take 1 tablet (600 mg total) by mouth every 6 (six) hours. 30 tablet 0  . methocarbamol (ROBAXIN) 500 MG tablet Take 1 tablet (500 mg total) by mouth 2 (two) times daily as needed for muscle spasms. 12 tablet 0  . naproxen (NAPROSYN) 500 MG tablet Take 1 tablet (500 mg total) by mouth 2 (two) times daily as needed. 30 tablet 0  . promethazine (PHENERGAN) 25 MG tablet Take 1 tablet (25 mg total) by mouth every 6 (six) hours as needed for nausea or vomiting. 10 tablet 0   No current facility-administered medications on file prior to visit.      Past Medical History:  Diagnosis Date  . Medical history non-contributory     Past Surgical History:  Procedure Laterality Date  . KNEE ARTHROSCOPY W/ ACL RECONSTRUCTION      No Known Allergies  Family History  Problem Relation Age of Onset  . Diabetes Paternal Grandmother   . Kidney disease Father   . Thyroid disease Mother   . Cancer Neg Hx     Social History   Socioeconomic History  . Marital status: Single  Spouse name: Not on file  . Number of children: Not on file  . Years of education: Not on file  . Highest education level: Not on file  Occupational History  . Not on file  Social Needs  . Financial resource strain: Not on file  . Food insecurity:    Worry: Not on file    Inability: Not on file  . Transportation needs:    Medical: Not on file    Non-medical: Not on  file  Tobacco Use  . Smoking status: Former Smoker    Types: Cigarettes  . Smokeless tobacco: Never Used  Substance and Sexual Activity  . Alcohol use: No  . Drug use: No  . Sexual activity: Yes    Birth control/protection: None  Lifestyle  . Physical activity:    Days per week: 3 days    Minutes per session: 30 min  . Stress: Not on file  Relationships  . Social connections:    Talks on phone: Not on file    Gets together: Not on file    Attends religious service: Not on file    Active member of club or organization: Not on file    Attends meetings of clubs or organizations: Not on file    Relationship status: Not on file  Other Topics Concern  . Not on file  Social History Narrative  . Not on file     Vitals:   07/10/17 0705  BP: 122/80  Pulse: 77  Resp: 12  Temp: 98.4 F (36.9 C)  SpO2: 97%   Body mass index is 32.68 kg/m.   Wt Readings from Last 3 Encounters:  07/10/17 254 lb 8 oz (115.4 kg)  03/26/16 260 lb (117.9 kg)  07/14/14 247 lb (112 kg)    Physical Exam  Nursing note and vitals reviewed. Constitutional: She is oriented to person, place, and time. She appears well-developed. No distress.  HENT:  Head: Normocephalic and atraumatic.  Right Ear: Hearing, tympanic membrane, external ear and ear canal normal.  Left Ear: Hearing, tympanic membrane, external ear and ear canal normal.  Mouth/Throat: Uvula is midline, oropharynx is clear and moist and mucous membranes are normal.  Eyes: Pupils are equal, round, and reactive to light. Conjunctivae and EOM are normal.  Neck: No tracheal deviation present. No thyromegaly present.  Cardiovascular: Normal rate and regular rhythm.  No murmur heard. Pulses:      Dorsalis pedis pulses are 2+ on the right side, and 2+ on the left side.  Respiratory: Effort normal and breath sounds normal. No respiratory distress. No breast swelling or tenderness.  GI: Soft. She exhibits no mass. There is no hepatomegaly. There  is no tenderness.  Genitourinary: There is no rash, tenderness or lesion on the right labia. There is no rash, tenderness or lesion on the left labia. Uterus is not enlarged and not tender. Cervix exhibits no motion tenderness, no discharge and no friability. Right adnexum displays no mass, no tenderness and no fullness. Left adnexum displays no mass, no tenderness and no fullness.  Genitourinary Comments: Breast: Mild fibrocystic changes on outer upper quadrants, bilateral.  No masses, skin abnormalities, or nipple discharge bilaterally.  Pap smear was collected.  Musculoskeletal: She exhibits no edema.  No major deformity or signs of synovitis appreciated.  Lymphadenopathy:    She has no cervical adenopathy.       Right: No supraclavicular adenopathy present.       Left: No supraclavicular adenopathy present.  Neurological: She  is alert and oriented to person, place, and time. She has normal strength. No cranial nerve deficit. Coordination and gait normal.  Reflex Scores:      Bicep reflexes are 2+ on the right side and 2+ on the left side.      Patellar reflexes are 2+ on the right side and 2+ on the left side. Skin: Skin is warm. No rash noted. No erythema.  Psychiatric: She has a normal mood and affect. Her speech is normal.  Well groomed, good eye contact.      ASSESSMENT AND PLAN:  Laurie Hooper was here today annual physical examination.   Orders Placed This Encounter  Procedures  . POCT urine pregnancy  . POCT glycosylated hemoglobin (Hb A1C)    Lab Results  Component Value Date   HGBA1C 5.2 07/10/2017    Routine general medical examination at a health care facility  We discussed the importance of regular physical activity and healthy diet for prevention of chronic illness and/or complications. Preventive guidelines reviewed. Pap smear collected. Not interested in chlamydia screening. STD prevention discussed. Vaccination up to date.  Next CPE in a  year.  Cervical cancer screening -     Cytology - PAP ([place])  Encounter for initial prescription of contraceptive pills -     POCT urine pregnancy -     norgestimate-ethinyl estradiol (ORTHO-CYCLEN,SPRINTEC,PREVIFEM) 0.25-35 MG-MCG tablet; Take 1 tablet by mouth daily.  Hyperglycemia -     POCT glycosylated hemoglobin (Hb A1C)  Diabetes mellitus screening -     POCT glycosylated hemoglobin (Hb A1C)  Dysmenorrhea, unspecified  Some side effects of OCP's discussed,including mood changes,GI<and thrombotic events.  -     norgestimate-ethinyl estradiol (ORTHO-CYCLEN,SPRINTEC,PREVIFEM) 0.25-35 MG-MCG tablet; Take 1 tablet by mouth daily.    Return in 1 year (on 07/11/2018) for CPE.      Dailey Alberson G. [place], MD  [facility]. [place] office.

## 2017-07-10 NOTE — Patient Instructions (Signed)
A few things to remember from today's visit:   Routine general medical examination at a health care facility  Cervical cancer screening - Plan: Cytology - PAP ([place])  Encounter for initial prescription of contraceptive pills - Plan: POCT urine pregnancy, norgestimate-ethinyl estradiol (ORTHO-CYCLEN,SPRINTEC,PREVIFEM) 0.25-35 MG-MCG tablet  Hyperglycemia - Plan: POCT glycosylated hemoglobin (Hb A1C)  Diabetes mellitus screening - Plan: POCT glycosylated hemoglobin (Hb A1C)  Dysmenorrhea, unspecified - Plan: norgestimate-ethinyl estradiol (ORTHO-CYCLEN,SPRINTEC,PREVIFEM) 0.25-35 MG-MCG tablet   Today you have you routine preventive visit.  At least 150 minutes of moderate exercise per week, daily brisk walking for 15-30 min is a good exercise option. Healthy diet low in saturated (animal) fats and sweets and consisting of fresh fruits and vegetables, lean meats such as fish and white chicken and whole grains.   These are some of recommendations for screening depending of age and risk factors:   - Vaccines:  Tdap vaccine every 10 years.  Shingles vaccine recommended at age 25, could be given after 25 years of age but not sure about insurance coverage.   Pneumonia vaccines:  Prevnar 13 at 65 and Pneumovax at 66. Sometimes Pneumovax is giving earlier if history of smoking, lung disease,diabetes,kidney disease among some.    Screening for diabetes at age 25 and every 3 years. We did it today because mildly elevated blood sugar in the past.  Cervical cancer prevention:  Pap smear starts at 25 years of age and continues periodically until 25 years old in low risk women. Pap smear every 3 years between 6721 and 47106 years old. Pap smear every 3-5 years between women 30 and older if pap smear negative and HPV screening negative.   -Breast cancer: Mammogram: There is disagreement between experts about when to start screening in low risk asymptomatic female but recent recommendations  are to start screening at 3940 and not later than 25 years old , every 1-2 years and after 25 yo q 2 years. Screening is recommended until 25 years old but some women can continue screening depending of healthy issues.   Colon cancer screening: starts at 25 years old until 25 years old.  Cholesterol disorder screening at age 25 and every 3 years.  Also recommended:  1. Dental visit- Brush and floss your teeth twice daily; visit your dentist twice a year. 2. Eye doctor- Get an eye exam at least every 2 years. 3. Helmet use- Always wear a helmet when riding a bicycle, motorcycle, rollerblading or skateboarding. 4. Safe sex- If you may be exposed to sexually transmitted infections, use a condom. 5. Seat belts- Seat belts can save your live; always wear one. 6. Smoke/Carbon Monoxide detectors- These detectors need to be installed on the appropriate level of your home. Replace batteries at least once a year. 7. Skin cancer- When out in the sun please cover up and use sunscreen 15 SPF or higher. 8. Violence- If anyone is threatening or hurting you, please tell your healthcare provider.  9. Drink alcohol in moderation- Limit alcohol intake to one drink or less per day. Never drink and drive.  Please be sure medication list is accurate. If a new problem present, please set up appointment sooner than planned today.

## 2017-07-15 ENCOUNTER — Other Ambulatory Visit: Payer: Self-pay | Admitting: Family Medicine

## 2017-07-15 ENCOUNTER — Encounter: Payer: Self-pay | Admitting: Family Medicine

## 2017-07-15 DIAGNOSIS — R87612 Low grade squamous intraepithelial lesion on cytologic smear of cervix (LGSIL): Secondary | ICD-10-CM

## 2017-07-15 LAB — CYTOLOGY - PAP

## 2017-08-01 ENCOUNTER — Ambulatory Visit: Payer: 59 | Admitting: Obstetrics & Gynecology

## 2018-03-13 ENCOUNTER — Encounter (HOSPITAL_COMMUNITY): Payer: Self-pay | Admitting: Emergency Medicine

## 2018-03-13 ENCOUNTER — Other Ambulatory Visit: Payer: Self-pay

## 2018-03-13 ENCOUNTER — Emergency Department (HOSPITAL_COMMUNITY)
Admission: EM | Admit: 2018-03-13 | Discharge: 2018-03-13 | Disposition: A | Discharge status: Home / Self Care | Payer: 59 | Attending: Emergency Medicine | Admitting: Emergency Medicine

## 2018-03-13 DIAGNOSIS — G4709 Other insomnia: Secondary | ICD-10-CM | POA: Diagnosis not present

## 2018-03-13 DIAGNOSIS — F419 Anxiety disorder, unspecified: Secondary | ICD-10-CM | POA: Insufficient documentation

## 2018-03-13 DIAGNOSIS — R441 Visual hallucinations: Secondary | ICD-10-CM | POA: Diagnosis present

## 2018-03-13 DIAGNOSIS — Z87891 Personal history of nicotine dependence: Secondary | ICD-10-CM | POA: Diagnosis not present

## 2018-03-13 DIAGNOSIS — Z79899 Other long term (current) drug therapy: Secondary | ICD-10-CM | POA: Insufficient documentation

## 2018-03-13 LAB — CBC WITH DIFFERENTIAL/PLATELET
Abs Immature Granulocytes: 0.04 10*3/uL (ref 0.00–0.07)
BASOS PCT: 1 %
Basophils Absolute: 0.1 10*3/uL (ref 0.0–0.1)
EOS PCT: 3 %
Eosinophils Absolute: 0.2 10*3/uL (ref 0.0–0.5)
HCT: 39.8 % (ref 36.0–46.0)
Hemoglobin: 13.1 g/dL (ref 12.0–15.0)
Immature Granulocytes: 0 %
Lymphocytes Relative: 32 %
Lymphs Abs: 3.1 10*3/uL (ref 0.7–4.0)
MCH: 28.7 pg (ref 26.0–34.0)
MCHC: 32.9 g/dL (ref 30.0–36.0)
MCV: 87.3 fL (ref 80.0–100.0)
MONO ABS: 0.6 10*3/uL (ref 0.1–1.0)
Monocytes Relative: 6 %
NEUTROS ABS: 5.6 10*3/uL (ref 1.7–7.7)
Neutrophils Relative %: 58 %
PLATELETS: 245 10*3/uL (ref 150–400)
RBC: 4.56 MIL/uL (ref 3.87–5.11)
RDW: 12.5 % (ref 11.5–15.5)
WBC: 9.6 10*3/uL (ref 4.0–10.5)
nRBC: 0 % (ref 0.0–0.2)

## 2018-03-13 LAB — COMPREHENSIVE METABOLIC PANEL
ALT: 17 U/L (ref 0–44)
ANION GAP: 7 (ref 5–15)
AST: 16 U/L (ref 15–41)
Albumin: 4 g/dL (ref 3.5–5.0)
Alkaline Phosphatase: 49 U/L (ref 38–126)
BUN: 12 mg/dL (ref 6–20)
CO2: 21 mmol/L — AB (ref 22–32)
Calcium: 8.4 mg/dL — ABNORMAL LOW (ref 8.9–10.3)
Chloride: 107 mmol/L (ref 98–111)
Creatinine, Ser: 0.84 mg/dL (ref 0.44–1.00)
GFR calc Af Amer: >60 mL/min (ref >60)
Glucose, Bld: 99 mg/dL (ref 70–99)
Potassium: 3.4 mmol/L — ABNORMAL LOW (ref 3.5–5.1)
SODIUM: 135 mmol/L (ref 135–145)
Total Bilirubin: 1 mg/dL (ref 0.3–1.2)
Total Protein: 6.9 g/dL (ref 6.5–8.1)

## 2018-03-13 LAB — I-STAT BETA HCG BLOOD, ED (MC, WL, AP ONLY): HCG, QUANTITATIVE: 935.1 m[IU]/mL — AB (ref <5)

## 2018-03-13 LAB — RAPID URINE DRUG SCREEN, HOSP PERFORMED
Amphetamines: NOT DETECTED
Barbiturates: NOT DETECTED
Benzodiazepines: NOT DETECTED
Cocaine: NOT DETECTED
Opiates: NOT DETECTED
Tetrahydrocannabinol: POSITIVE — AB

## 2018-03-13 LAB — HCG, QUANTITATIVE, PREGNANCY: hCG, Beta Chain, Quant, S: 898 m[IU]/mL — ABNORMAL HIGH (ref <5)

## 2018-03-13 LAB — ETHANOL: Alcohol, Ethyl (B): <10 mg/dL (ref <10)

## 2018-03-13 MED ORDER — LORAZEPAM 1 MG PO TABS
1.0000 mg | ORAL_TABLET | Freq: Once | ORAL | Status: AC
  Administered 2018-03-13: 1 mg via ORAL
  Filled 2018-03-13: qty 1

## 2018-03-13 MED ORDER — ALPRAZOLAM 0.25 MG PO TABS: 0.2500 mg | ORAL_TABLET | Freq: Every evening | ORAL | 0 refills | Status: DC | PRN

## 2018-03-13 NOTE — ED Provider Notes (Signed)
[facility] EMERGENCY DEPARTMENT Provider Note   CSN: [id number] Arrival date & time: 03/13/18  0117    History   Chief Complaint Chief Complaint  Patient presents with  . Mental Health Problem    HPI Laurie Hooper is a 28 y.o. female.     The history is provided by the patient.  Mental Health Problem  Presenting symptoms: hallucinations   Presenting symptoms: no suicidal thoughts   Degree of incapacity (severity):  Moderate Onset quality:  Gradual Duration:  2 days Timing:  Constant Progression:  Worsening Chronicity:  New Context: stressful life event   Context: not alcohol use, not drug abuse and not medication   Treatment compliance:  Untreated Relieved by:  None tried Worsened by:  Nothing Associated symptoms: anxiety, distractible, fatigue, insomnia and irritability   Associated symptoms: no abdominal pain, no chest pain and no headaches   Patient presents for insomnia and possible hallucinations.  She reports she has not slept in 48 hours.  She reports she has racing thoughts at night, feels anxious and is unable to fall asleep.  She is now feeling exhausted, and does feel that she is having visual hallucinations.  Denies auditory hallucinations.  She reports when she was driving she saw the trees "squiggle" Never had this before Denies drug abuse. No SI  Past Medical History:  Diagnosis Date  . Medical history non-contributory     Patient Active Problem List   Diagnosis Date Noted  . Encounter for initial prescription of contraceptive pills 07/10/2017  . Dysmenorrhea, unspecified 07/10/2017  . Labor and delivery indication for care or intervention 06/13/2014  . Subchorionic hematoma in second trimester 01/14/2014  . Placental abnormality in first trimester 01/14/2014  . Chlamydia infection affecting pregnancy in first trimester, antepartum 11/25/2013  . Supervision of normal pregnancy 11/04/2013    Past Surgical History:  Procedure Laterality  Date  . KNEE ARTHROSCOPY W/ ACL RECONSTRUCTION       OB History    Gravida  1   Para  1   Term  1   Preterm      AB      Living  1     SAB      TAB      Ectopic      Multiple  0   Live Births  1            Home Medications    Prior to Admission medications   Medication Sig Start Date End Date Taking? Authorizing Provider  HYDROcodone-acetaminophen (NORCO/VICODIN) 5-325 MG tablet Take 1 tablet by mouth every 6 (six) hours as needed. 03/26/16   Ward, Chase Picket, PA-C  ibuprofen (ADVIL,MOTRIN) 600 MG tablet Take 1 tablet (600 mg total) by mouth every 6 (six) hours. 06/16/14   Fabio Asa, MD  methocarbamol (ROBAXIN) 500 MG tablet Take 1 tablet (500 mg total) by mouth 2 (two) times daily as needed for muscle spasms. 03/26/16   Ward, Chase Picket, PA-C  naproxen (NAPROSYN) 500 MG tablet Take 1 tablet (500 mg total) by mouth 2 (two) times daily as needed. 03/26/16   Ward, Chase Picket, PA-C  norgestimate-ethinyl estradiol (ORTHO-CYCLEN,SPRINTEC,PREVIFEM) 0.25-35 MG-MCG tablet Take 1 tablet by mouth daily. 07/10/17   [place], Betty G, MD  promethazine (PHENERGAN) 25 MG tablet Take 1 tablet (25 mg total) by mouth every 6 (six) hours as needed for nausea or vomiting. 08/14/15   Tyrone Nine, MD    Family History Family History  Problem Relation  Age of Onset  . Diabetes Paternal Grandmother   . Kidney disease Father   . Thyroid disease Mother   . Cancer Neg Hx     Social History Social History   Tobacco Use  . Smoking status: Former Smoker    Types: Cigarettes  . Smokeless tobacco: Never Used  Substance Use Topics  . Alcohol use: No  . Drug use: No     Allergies   Patient has no known allergies.   Review of Systems Review of Systems  Constitutional: Positive for fatigue and irritability.  Cardiovascular: Negative for chest pain.  Gastrointestinal: Negative for abdominal pain and vomiting.  Neurological: Negative for weakness and headaches.    Psychiatric/Behavioral: Positive for hallucinations and sleep disturbance. Negative for suicidal ideas. The patient is nervous/anxious and has insomnia.   All other systems reviewed and are negative.    Physical Exam Updated Vital Signs BP 127/77 (BP Location: Right Arm)   Pulse (!) 110   Temp 98.2 F (36.8 C) (Oral)   Resp 16   LMP 02/10/2018   SpO2 98%   Physical Exam  CONSTITUTIONAL: Well developed/well nourished, anxious HEAD: Normocephalic/atraumatic EYES: EOMI/PERRL ENMT: Mucous membranes moist NECK: supple no meningeal signs SPINE/BACK:entire spine nontender CV: S1/S2 noted, no murmurs/rubs/gallops noted LUNGS: Lungs are clear to auscultation bilaterally, no apparent distress ABDOMEN: soft, nontender, no rebound or guarding, bowel sounds noted throughout abdomen GU:no cva tenderness NEURO: Pt is awake/alert/appropriate, moves all extremitiesx4.  No facial droop.  No arm or leg drift. GCS 15.  Mental status is appropriate. Answers all questions appropriately EXTREMITIES: pulses normal/equal, full ROM SKIN: warm, color normal PSYCH: Anxious  ED Treatments / Results  Labs (all labs ordered are listed, but only abnormal results are displayed) Labs Reviewed  COMPREHENSIVE METABOLIC PANEL - Abnormal; Notable for the following components:      Result Value   Potassium 3.4 (*)    CO2 21 (*)    Calcium 8.4 (*)    All other components within normal limits  RAPID URINE DRUG SCREEN, HOSP PERFORMED - Abnormal; Notable for the following components:   Tetrahydrocannabinol POSITIVE (*)    All other components within normal limits  HCG, QUANTITATIVE, PREGNANCY - Abnormal; Notable for the following components:   hCG, Beta Chain, Quant, S 898 (*)    All other components within normal limits  CBC WITH DIFFERENTIAL/PLATELET  ETHANOL    EKG None  Radiology No results found.  Procedures Procedures    Medications Ordered in ED Medications  LORazepam (ATIVAN) tablet 1  mg (has no administration in time range)  LORazepam (ATIVAN) tablet 1 mg (1 mg Oral Given 03/13/18 0149)     Initial Impression / Assessment and Plan / ED Course  I have reviewed the triage vital signs and the nursing notes.  Pertinent labs  results that were available during my care of the patient were reviewed by me and considered in my medical decision making (see chart for details).        1:52 AM Patient presents for anxiety, lack of sleep, now reporting hallucinations.  Strong suspicion that all of her symptoms are due to sleep deprivation.  She reports racing thoughts and significant stressors at home.  Suspect that is disrupting her sleep, which is now leading her to have what she feels is visual hallucinations.  Her mental status is appropriate, she does not appear psychotic.  She does appear anxious.  She is agreed to start with Ativan here 2:31 AM Appears to  be improved, she is resting comfortably.  She now admits to eating food recently that was infused with marijuana.  She denies smoking marijuana or any other drug use 3:44 AM Patient appears improved.  She is in no acute distress. She was informed that she may be pregnant. I did advise her that this is very early on and she will need follow-up.  She is already expressing thoughts of an elective abortion.  I advised her that this is her choice, but if she should change her mind she can follow-up with her OB/GYN.  She is requesting medications for anxiety.  Short course of Xanax has been ordered for patient.  She reports she has been seen by a counselor in the past, we both agreed that this would be useful for her to help her deal with her anxiety.  Therefore the Xanax can be more of a last resort for anxiety. She is awake alert in no acute distress.  She does not appear psychotic.  She does not appear to be a harm to herself.  We discussed strategies to improve insomnia.  Advised to avoid caffeine, try to get physical activity and  she will follow-up with a counselor. Final Clinical Impressions(s) / ED Diagnoses   Final diagnoses:  Anxiety  Other insomnia    ED Discharge Orders         Ordered    ALPRAZolam (XANAX) 0.25 MG tablet  At bedtime PRN     03/13/18 0344           Zadie Rhine, MD 03/13/18 [phone]

## 2018-03-13 NOTE — ED Triage Notes (Addendum)
Pt states that she has not slept in 48 hours. Pt states she will drink pepsi or tea to keep her alert during the day. Pt denies any auditory hallucinations. Pt does state that "I see trees wiggle and think I see a mouse on the ground."

## 2018-07-28 ENCOUNTER — Encounter (HOSPITAL_COMMUNITY): Payer: Self-pay | Admitting: *Deleted

## 2018-07-28 ENCOUNTER — Other Ambulatory Visit: Payer: Self-pay

## 2018-07-28 ENCOUNTER — Emergency Department (HOSPITAL_COMMUNITY)
Admission: EM | Admit: 2018-07-28 | Discharge: 2018-07-28 | Disposition: A | Discharge status: Other Acute Inpatient Hospital | Payer: Medicaid Other | Attending: Emergency Medicine | Admitting: Emergency Medicine

## 2018-07-28 ENCOUNTER — Inpatient Hospital Stay (HOSPITAL_COMMUNITY)
Admission: AD | Admit: 2018-07-28 | Discharge: 2018-07-31 | DRG: 885 | Disposition: A | Discharge status: Home / Self Care | Payer: Federal, State, Local not specified - Other | Attending: Psychiatry | Admitting: Psychiatry

## 2018-07-28 ENCOUNTER — Ambulatory Visit (HOSPITAL_COMMUNITY)
Admission: RE | Admit: 2018-07-28 | Discharge: 2018-07-28 | Disposition: A | Discharge status: Home / Self Care | Payer: Federal, State, Local not specified - Other | Source: Home / Self Care | Attending: Psychiatry | Admitting: Psychiatry

## 2018-07-28 ENCOUNTER — Encounter (HOSPITAL_COMMUNITY): Payer: Self-pay | Admitting: Emergency Medicine

## 2018-07-28 DIAGNOSIS — Z79899 Other long term (current) drug therapy: Secondary | ICD-10-CM | POA: Insufficient documentation

## 2018-07-28 DIAGNOSIS — F315 Bipolar disorder, current episode depressed, severe, with psychotic features: Principal | ICD-10-CM | POA: Diagnosis present

## 2018-07-28 DIAGNOSIS — Z87891 Personal history of nicotine dependence: Secondary | ICD-10-CM | POA: Insufficient documentation

## 2018-07-28 DIAGNOSIS — F319 Bipolar disorder, unspecified: Secondary | ICD-10-CM | POA: Insufficient documentation

## 2018-07-28 DIAGNOSIS — Z20828 Contact with and (suspected) exposure to other viral communicable diseases: Secondary | ICD-10-CM | POA: Diagnosis not present

## 2018-07-28 DIAGNOSIS — Z841 Family history of disorders of kidney and ureter: Secondary | ICD-10-CM | POA: Diagnosis not present

## 2018-07-28 DIAGNOSIS — Z8349 Family history of other endocrine, nutritional and metabolic diseases: Secondary | ICD-10-CM | POA: Diagnosis not present

## 2018-07-28 DIAGNOSIS — G4700 Insomnia, unspecified: Secondary | ICD-10-CM | POA: Diagnosis present

## 2018-07-28 DIAGNOSIS — Z0489 Encounter for examination and observation for other specified reasons: Secondary | ICD-10-CM | POA: Diagnosis present

## 2018-07-28 DIAGNOSIS — F29 Unspecified psychosis not due to a substance or known physiological condition: Secondary | ICD-10-CM | POA: Diagnosis not present

## 2018-07-28 DIAGNOSIS — Z833 Family history of diabetes mellitus: Secondary | ICD-10-CM

## 2018-07-28 LAB — COMPREHENSIVE METABOLIC PANEL
ALT: 13 U/L (ref 0–44)
AST: 15 U/L (ref 15–41)
Albumin: 4 g/dL (ref 3.5–5.0)
Alkaline Phosphatase: 54 U/L (ref 38–126)
Anion gap: 10 (ref 5–15)
BUN: 13 mg/dL (ref 6–20)
CO2: 21 mmol/L — ABNORMAL LOW (ref 22–32)
Calcium: 9 mg/dL (ref 8.9–10.3)
Chloride: 109 mmol/L (ref 98–111)
Creatinine, Ser: 0.96 mg/dL (ref 0.44–1.00)
GFR calc Af Amer: >60 mL/min (ref >60)
GFR calc non Af Amer: >60 mL/min (ref >60)
Glucose, Bld: 136 mg/dL — ABNORMAL HIGH (ref 70–99)
Potassium: 3.4 mmol/L — ABNORMAL LOW (ref 3.5–5.1)
Sodium: 140 mmol/L (ref 135–145)
Total Bilirubin: 1 mg/dL (ref 0.3–1.2)
Total Protein: 7.2 g/dL (ref 6.5–8.1)

## 2018-07-28 LAB — ETHANOL: Alcohol, Ethyl (B): <10 mg/dL (ref <10)

## 2018-07-28 LAB — CBC
HCT: 39.8 % (ref 36.0–46.0)
Hemoglobin: 13.3 g/dL (ref 12.0–15.0)
MCH: 29.4 pg (ref 26.0–34.0)
MCHC: 33.4 g/dL (ref 30.0–36.0)
MCV: 87.9 fL (ref 80.0–100.0)
Platelets: 307 10*3/uL (ref 150–400)
RBC: 4.53 MIL/uL (ref 3.87–5.11)
RDW: 12 % (ref 11.5–15.5)
WBC: 9.1 10*3/uL (ref 4.0–10.5)
nRBC: 0 % (ref 0.0–0.2)

## 2018-07-28 LAB — RAPID URINE DRUG SCREEN, HOSP PERFORMED
Amphetamines: NOT DETECTED
Barbiturates: NOT DETECTED
Benzodiazepines: NOT DETECTED
Cocaine: NOT DETECTED
Opiates: NOT DETECTED
Tetrahydrocannabinol: POSITIVE — AB

## 2018-07-28 LAB — ACETAMINOPHEN LEVEL: Acetaminophen (Tylenol), Serum: <10 ug/mL — ABNORMAL LOW (ref 10–30)

## 2018-07-28 LAB — I-STAT BETA HCG BLOOD, ED (MC, WL, AP ONLY): I-stat hCG, quantitative: <5 m[IU]/mL (ref <5)

## 2018-07-28 LAB — SARS CORONAVIRUS 2 BY RT PCR (HOSPITAL ORDER, PERFORMED IN ~~LOC~~ HOSPITAL LAB): SARS Coronavirus 2: NEGATIVE

## 2018-07-28 LAB — SALICYLATE LEVEL: Salicylate Lvl: <7 mg/dL (ref 2.8–30.0)

## 2018-07-28 LAB — TSH: TSH: 1.974 u[IU]/mL (ref 0.350–4.500)

## 2018-07-28 MED ORDER — DIPHENHYDRAMINE HCL 50 MG/ML IJ SOLN
INTRAMUSCULAR | Status: AC
  Filled 2018-07-28: qty 1

## 2018-07-28 MED ORDER — DIPHENHYDRAMINE HCL 50 MG/ML IJ SOLN
25.0000 mg | Freq: Once | INTRAMUSCULAR | Status: AC
  Administered 2018-07-28: 25 mg via INTRAMUSCULAR

## 2018-07-28 MED ORDER — CARBAMAZEPINE 100 MG PO CHEW
200.0000 mg | CHEWABLE_TABLET | Freq: Two times a day (BID) | ORAL | Status: DC
  Administered 2018-07-28 – 2018-07-31 (×6): 200 mg via ORAL
  Filled 2018-07-28: qty 2
  Filled 2018-07-28: qty 28
  Filled 2018-07-28: qty 2
  Filled 2018-07-28: qty 28
  Filled 2018-07-28 (×2): qty 2
  Filled 2018-07-28: qty 28
  Filled 2018-07-28 (×2): qty 2
  Filled 2018-07-28: qty 28
  Filled 2018-07-28 (×4): qty 2

## 2018-07-28 MED ORDER — HALOPERIDOL LACTATE 5 MG/ML IJ SOLN
10.0000 mg | Freq: Once | INTRAMUSCULAR | Status: AC
  Administered 2018-07-28: 5 mg via INTRAMUSCULAR

## 2018-07-28 MED ORDER — ALUM & MAG HYDROXIDE-SIMETH 200-200-20 MG/5ML PO SUSP: 30.0000 mL | ORAL | Status: DC | PRN

## 2018-07-28 MED ORDER — TEMAZEPAM 30 MG PO CAPS
30.0000 mg | ORAL_CAPSULE | Freq: Every day | ORAL | Status: DC
  Administered 2018-07-28 – 2018-07-30 (×3): 30 mg via ORAL
  Filled 2018-07-28 (×3): qty 1
  Filled 2018-07-28: qty 3

## 2018-07-28 MED ORDER — LORAZEPAM 2 MG/ML IJ SOLN
2.0000 mg | Freq: Once | INTRAMUSCULAR | Status: DC
  Filled 2018-07-28 (×2): qty 1

## 2018-07-28 MED ORDER — HYDROXYZINE HCL 25 MG PO TABS: 25.0000 mg | ORAL_TABLET | Freq: Three times a day (TID) | ORAL | Status: DC | PRN

## 2018-07-28 MED ORDER — RISPERIDONE 3 MG PO TABS
3.0000 mg | ORAL_TABLET | Freq: Every day | ORAL | Status: DC
  Filled 2018-07-28 (×2): qty 1

## 2018-07-28 MED ORDER — NORGESTIMATE-ETH ESTRADIOL 0.25-35 MG-MCG PO TABS: 1.0000 | ORAL_TABLET | Freq: Every day | ORAL | Status: DC

## 2018-07-28 MED ORDER — MAGNESIUM HYDROXIDE 400 MG/5ML PO SUSP: 30.0000 mL | Freq: Every day | ORAL | Status: DC | PRN

## 2018-07-28 MED ORDER — RISPERIDONE 2 MG PO TABS
4.0000 mg | ORAL_TABLET | Freq: Every day | ORAL | Status: DC
  Administered 2018-07-28 – 2018-07-30 (×3): 4 mg via ORAL
  Filled 2018-07-28: qty 2
  Filled 2018-07-28: qty 14
  Filled 2018-07-28: qty 2
  Filled 2018-07-28: qty 14
  Filled 2018-07-28 (×4): qty 2

## 2018-07-28 MED ORDER — ZIPRASIDONE MESYLATE 20 MG IM SOLR: 20.0000 mg | INTRAMUSCULAR | Status: DC | PRN

## 2018-07-28 MED ORDER — ALPRAZOLAM 0.25 MG PO TABS: 0.2500 mg | ORAL_TABLET | Freq: Every evening | ORAL | Status: DC | PRN

## 2018-07-28 MED ORDER — ACETAMINOPHEN 325 MG PO TABS: 650.0000 mg | ORAL_TABLET | Freq: Four times a day (QID) | ORAL | Status: DC | PRN

## 2018-07-28 MED ORDER — ZIPRASIDONE MESYLATE 20 MG IM SOLR
20.0000 mg | Freq: Once | INTRAMUSCULAR | Status: DC
  Filled 2018-07-28: qty 20

## 2018-07-28 MED ORDER — OLANZAPINE 10 MG PO TBDP: 10.0000 mg | ORAL_TABLET | Freq: Three times a day (TID) | ORAL | Status: DC | PRN

## 2018-07-28 MED ORDER — LORAZEPAM 2 MG/ML IJ SOLN: 1.0000 mg | Freq: Once | INTRAMUSCULAR | Status: DC

## 2018-07-28 MED ORDER — LORAZEPAM 1 MG PO TABS: 1.0000 mg | ORAL_TABLET | ORAL | Status: DC | PRN

## 2018-07-28 MED ORDER — POTASSIUM CHLORIDE CRYS ER 20 MEQ PO TBCR
40.0000 meq | EXTENDED_RELEASE_TABLET | Freq: Once | ORAL | Status: DC
  Filled 2018-07-28: qty 2

## 2018-07-28 MED ORDER — BENZTROPINE MESYLATE 1 MG PO TABS: 1.0000 mg | ORAL_TABLET | Freq: Every day | ORAL | Status: DC

## 2018-07-28 MED ORDER — STERILE WATER FOR INJECTION IJ SOLN
INTRAMUSCULAR | Status: AC
  Filled 2018-07-28: qty 10

## 2018-07-28 MED ORDER — TRAZODONE HCL 50 MG PO TABS: 50.0000 mg | ORAL_TABLET | Freq: Every evening | ORAL | Status: DC | PRN

## 2018-07-28 MED ORDER — HALOPERIDOL LACTATE 5 MG/ML IJ SOLN
INTRAMUSCULAR | Status: AC
  Filled 2018-07-28: qty 2

## 2018-07-28 NOTE — ED Notes (Signed)
Ativan 2mg  given IM in [place] witnessed by [facility], RN

## 2018-07-28 NOTE — ED Notes (Addendum)
Kea Callan Mother [phone] Cory Roughen [phone] (pt lives with grandmother) Agricultural consultant spoke with Grandmother in detail regarding visitor policy and process for [facility] pts.  She would like physician to call her.  I gave her the ED number to call back if she has not heard back in a couple hours.

## 2018-07-28 NOTE — BH Assessment (Signed)
Assessment Note  Laurie Hooper is an 28 y.o. single female who presents to [facility] accompanied by maternal grandparents, Mariam and Sherin Quarryorris Akins, who are concerned due to changes in Pt's mood and behavior. Pt was unable to properly complete the intake form. Pt appears psychotic and responds to most questions with "I love my family" or "no". Pt cannot say why she is at [facility] other than she loves her family. Pt reports her mood is "jovial." When asked how she is sleeping, Pt states "like a baby." Pt responds "no" to all depressive symptoms, suicidal ideation, homicidal ideation, auditory or visual hallucination but appears to be saying "no" to any questions asked of her. Pt says "no" to using marijuana but indicated she used "weed" on her intake form. Per Pt's medical record, Pt has tested positive for marijuana in the past when presenting with hallucinations. To open ended questions, she doesn't respond or says she loves her family.  Pt's grandparents says Pt quit working at a job three weeks ago. They reports that Pt had manic symptoms and insomnia in college and was diagnosed with bipolar disorder. They noticed approximately two weeks ago that she seemed somewhat accusatory to people. They says four days ago she became paranoid and accusing family members of spying on her and questioning who she could trust. Grandparents report she was accusing her four-year-old daughter of attempting to "brainwash" Pt's mother. Today Pt was staying her her great-grandmother and was outside in the yard, flailing her arms and yelling in distress. She also was standing over her great-grandmother, agitated and accusing her great-grandmother of controlling her.  Pt lives with her grandparents and her four-year-old child. She doesn't appear to taking any prescription medications. She has a boyfriend but grandparents says this boyfriend doesn't appear supportive of Pt. Pt has no mental health providers and no history of  inpatient psychiatric treatment.  Pt is casually dressed. She alert and oriented person, place and date. She is able to correctly name the current president. Pt speaks in a clear tone, at moderate volume and normal pace. Motor behavior appears normal. Eye contact is at times intense. Pt's mood is pleasant and affect is anxious and somewhat constricted. Thought process is circumstantial and poorly organized. Pt appears at times to be responding to internal stimuli. After the assessment, she was observed to be chanting.   Diagnosis: F31.5 Bipolar I disorder, Current or most recent episode depressed, With psychotic features  Past Medical History:  Past Medical History:  Diagnosis Date  . Medical history non-contributory     Past Surgical History:  Procedure Laterality Date  . KNEE ARTHROSCOPY W/ ACL RECONSTRUCTION      Family History:  Family History  Problem Relation Age of Onset  . Diabetes Paternal Grandmother   . Kidney disease Father   . Thyroid disease Mother   . Cancer Neg Hx     Social History:  reports that she has quit smoking. Her smoking use included cigarettes. She has never used smokeless tobacco. She reports that she does not drink alcohol or use drugs.  Additional Social History:  Alcohol / Drug Use Pain Medications: Denies use Prescriptions: Denies use Over the Counter: Denies use History of alcohol / drug use?: Yes(Pt has history of using marijuana, detail unknown) Longest period of sobriety (when/how long): unknown  CIWA:   COWS:    Allergies: No Known Allergies  Home Medications: (Not in a hospital admission)   OB/GYN Status:  No LMP recorded.  General Assessment Data Location of Assessment: [facility] Assessment Services TTS Assessment: In system Is this a Tele or Face-to-Face Assessment?: Face-to-Face Is this an Initial Assessment or a Re-assessment for this encounter?: Initial Assessment Patient Accompanied by:: Other(Grandparents) Language Other than  English: No Living Arrangements: Other (Comment)(Lives with grandparents) What gender do you identify as?: Female Marital status: Single Maiden name: NA Pregnancy Status: No Living Arrangements: Other relatives Can pt return to current living arrangement?: Yes Admission Status: Voluntary Is patient capable of signing voluntary admission?: Yes Referral Source: Self/Family/Friend Insurance type: Unknown  Medical Screening Exam ([place]) Medical Exam completed: Janora Norlander, NP)  Crisis Care Plan Living Arrangements: Other relatives Legal Guardian: Other:(Self) Name of Psychiatrist: None Name of Therapist: None  Education Status Is patient currently in school?: No Is the patient employed, unemployed or receiving disability?: Unemployed  Risk to self with the past 6 months Suicidal Ideation: No Has patient been a risk to self within the past 6 months prior to admission? : No Suicidal Intent: No Has patient had any suicidal intent within the past 6 months prior to admission? : No Is patient at risk for suicide?: No Suicidal Plan?: No Has patient had any suicidal plan within the past 6 months prior to admission? : No Access to Means: No What has been your use of drugs/alcohol within the last 12 months?: Pt has history of using marijuana Previous Attempts/Gestures: No How many times?: 0 Other Self Harm Risks: None Triggers for Past Attempts: None known Intentional Self Injurious Behavior: None Family Suicide History: No Recent stressful life event(s): Job Loss Persecutory voices/beliefs?: Yes Depression: No Depression Symptoms: Feeling angry/irritable, Insomnia Substance abuse history and/or treatment for substance abuse?: No Suicide prevention information given to non-admitted patients: Not applicable  Risk to Others within the past 6 months Homicidal Ideation: No Does patient have any lifetime risk of violence toward others beyond the six months prior to  admission? : No Thoughts of Harm to Others: No Current Homicidal Intent: No Current Homicidal Plan: No Access to Homicidal Means: No Identified Victim: None History of harm to others?: No Assessment of Violence: None Noted Violent Behavior Description: Pt denies history of violence Does patient have access to weapons?: No Criminal Charges Pending?: No Does patient have a court date: No Is patient on probation?: No  Psychosis Hallucinations: Auditory Delusions: Persecutory  Mental Status Report Appearance/Hygiene: Other (Comment)(Casually dressed) Eye Contact: Good Motor Activity: Unremarkable Speech: Logical/coherent Level of Consciousness: Alert Mood: Other (Comment)("Jovial") Affect: Other (Comment)(Pleasant) Anxiety Level: Minimal Thought Processes: Circumstantial Judgement: Impaired Orientation: Person, Place, Time, Situation Obsessive Compulsive Thoughts/Behaviors: None  Cognitive Functioning Concentration: Decreased Memory: Recent Intact, Remote Intact Is patient IDD: No Insight: Poor Impulse Control: Fair Appetite: Fair Have you had any weight changes? : No Change Sleep: No Change Total Hours of Sleep: (Unknown) Vegetative Symptoms: None  ADLScreening [facility] Assessment Services) Patient's cognitive ability adequate to safely complete daily activities?: Yes Patient able to express need for assistance with ADLs?: Yes Independently performs ADLs?: Yes (appropriate for developmental age)  Prior Inpatient Therapy Prior Inpatient Therapy: No  Prior Outpatient Therapy Prior Outpatient Therapy: Yes Prior Therapy Dates: unknown Prior Therapy Facilty/Provider(s): unknown Reason for Treatment: Bipolar disorder Does patient have an ACCT team?: No Does patient have Intensive In-House Services?  : No Does patient have [facility] services? : No Does patient have P4CC services?: No  ADL Screening (condition at time of admission) Patient's cognitive ability adequate to  safely complete daily activities?: Yes Is the  patient deaf or have difficulty hearing?: No Does the patient have difficulty seeing, even when wearing glasses/contacts?: No Does the patient have difficulty concentrating, remembering, or making decisions?: No Patient able to express need for assistance with ADLs?: Yes Does the patient have difficulty dressing or bathing?: No Independently performs ADLs?: Yes (appropriate for developmental age) Does the patient have difficulty walking or climbing stairs?: No Weakness of Legs: None Weakness of Arms/Hands: None  Home Assistive Devices/Equipment Home Assistive Devices/Equipment: None    Abuse/Neglect Assessment (Assessment to be complete while patient is alone) Abuse/Neglect Assessment Can Be Completed: Unable to assess, patient is non-responsive or altered mental status     Advance Directives (For Healthcare) Does Patient Have a Medical Advance Directive?: No Would patient like information on creating a medical advance directive?: No - Patient declined          Disposition: Gave clinical report to Lerry Linerashaun Dixon, NP who said Pt meets criteria for inpatient psychiatric treatment. Pt was unable to sign voluntary consent for treatment form, signing several names on the form that were not her own. [facility]  for medical [facility] and evaluation by EDP for involuntary commitment.  Disposition Initial Assessment Completed for this Encounter: Yes Disposition of Patient: Admit  On Site Evaluation by:  Lerry Linerashaun Dixon, NP Reviewed with Physician:    Pamalee LeydenFord Ellis Tommi Crepeau Jr, [facility], [facility], [facility] Triage Specialist [phone]  Patsy BaltimoreWarrick Jr, Harlin RainFord Ellis 07/28/2018 3:34 AM

## 2018-07-28 NOTE — Progress Notes (Signed)
Patient did not attend wrap-up group because she was asleep.  

## 2018-07-28 NOTE — ED Triage Notes (Addendum)
Pt went to [facility] and was sent to ED after not being able to sign the voluntary paperwork at [facility].  Pt states she is having dreams of a 26 yr old girl missing.  States she got married yesterday and the 23 yr old is the child of her husband.  States "she (the girl in her dreams) is missing in love and in a different place."  Pt denies SI/HI.  Denies AH/VH.  States she has been having the dreams for a year but have had them before.  PT lives with her grandmother and she would like the EDP to call her.  Pt is very paranoid and difficulty getting pt to change into burgundy paper scrubs.  Pt did change after multiple conversations reminding her of the plan of care.  Security notified to wand pt.

## 2018-07-28 NOTE — Progress Notes (Signed)
Pt admitted as inpatient to [facility].  Pt denied previous admission at this or any other behavioral facility.  Pt reported during admission that her boyfriend was proposing to her and her family tried to keep her away from him.  Pt sates that they both have one child and his daughter does not like her.  Pt then stated she was thinking she could be pregnant and later stated she had an abortion in June of this year.  Pt stated she doesn't remember what led up to her admission but stated that things became ugly in the ER between her family and her boyfriend's family.  Pt stated she recently took CBD and was told it was laced with something.  Pt then talking about detoxing her body.  Pt denied SI, HI and AVH.  Needs assessed.  Pt denied. Fifteen minute checks initiated for patient safety.  Patient safe on unit.

## 2018-07-28 NOTE — ED Notes (Signed)
Jarrett Soho, [place] notified of grandmother's request for follow-up phone call with updates and of her concerns.

## 2018-07-28 NOTE — ED Notes (Signed)
Charge RN called Grandmother, Mariam,  to follow-up and make sure she was updated on pt's plan of care.  PA did call her and answer questions.

## 2018-07-28 NOTE — [facility] Suicide Risk Assessment (Signed)
[facility] Admission Suicide Risk Assessment   Total Time spent with patient: 45 minutes Principal Problem: <principal problem not specified> Diagnosis:  Active Problems:   Bipolar depression (HCC)  Subjective Data: New onset/versus recurrence of manic type symptomatology with psychosis  Continued Clinical Symptoms:    The "Alcohol Use Disorders Identification Test", Guidelines for Use in Primary Care, Second Edition.  World Pharmacologist [facility]). Score between 0-7:  no or low risk or alcohol related problems. Score between 8-15:  moderate risk of alcohol related problems. Score between 16-19:  high risk of alcohol related problems. Score 20 or above:  warrants further diagnostic evaluation for alcohol dependence and treatment.   CLINICAL FACTORS:  Psychosis/affective instability    Musculoskeletal: Strength & Muscle Tone: within normal limits Gait & Station: normal Patient leans: N/A  Psychiatric Specialty Exam: Physical Exam vital stable no involuntary movements blood pressure low  ROS GI GU reports abortion recently/cardiovascular negative neurological negative for head trauma or seizures  Blood pressure 94/72, pulse 96, temperature 98.3 F (36.8 C), SpO2 100 %.There is no height or weight on file to calculate BMI.  General Appearance: Casual  Eye Contact:  Good  Speech:  Clear and Coherent  Volume:  Normal  Mood:  Generally contained to hypomanic at intervals  Affect:  Appropriate and Congruent  Thought Process:  Irrelevant and Descriptions of Associations: Loose  Orientation:  Full (Time, Place, and Person)  Thought Content:  Illogical and Delusions  Suicidal Thoughts:  No  Homicidal Thoughts:  No  Memory:  Immediate;   Good  Judgement:  Fair  Insight:  Fair  Psychomotor Activity:  Normal  Concentration:  Attention Span: Fair  Recall:  [employer] of Knowledge:  Fair  Language:  Good  Akathisia:  Negative  Handed:  Right  AIMS (if indicated):     Assets:   Physical Health Resilience  ADL's:  Intact  Cognition:  WNL  Sleep:           COGNITIVE FEATURES THAT CONTRIBUTE TO RISK:  Polarized thinking    SUICIDE RISK:   Minimal: No identifiable suicidal ideation.  Patients presenting with no risk factors but with morbid ruminations; may be classified as minimal risk based on the severity of the depressive symptoms  PLAN OF CARE: Begin antipsychotic therapy  I certify that inpatient services furnished can reasonably be expected to improve the patient's condition.   Johnn Hai, MD 07/28/2018, 4:35 PM

## 2018-07-28 NOTE — ED Notes (Signed)
Called for ivc service

## 2018-07-28 NOTE — ED Notes (Signed)
Pt's mother called in, asked this RN not to disclose any information to her mother other than that she was all right but then wanted to make phone call to mother to check on her daughter
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
## 2018-07-28 NOTE — ED Notes (Addendum)
Pt now quiet and changed into scrubs. Belongings at desk and wanded by Event organiser at bedside

## 2018-07-28 NOTE — Progress Notes (Signed)
Pt accepted to [facility], Bed 501-2   Linus Mako, NP is the accepting provider.  Johnn Hai, MD is the attending provider.  Call report to [phone]  [facility] ED notified.   Pt is IVC   Pt may be transported by [employer] Pt scheduled  to arrive at [facility] as soon as her pulse rate stabilizes A<99 and transport can be arranged  Romie Minus T. Judi Cong, MSW, [place] Disposition Clinical Social Work [phone] (cell) [phone] (office)

## 2018-07-28 NOTE — ED Provider Notes (Signed)
[facility] EMERGENCY DEPARTMENT Provider Note   CSN: [id number] Arrival date & time: 07/28/18  0421    History   Chief Complaint Chief Complaint  [facility] with  . [facility]    HPI Laurie Hooper is a 28 y.o. female with a hx of bipolar disorder [facility].  Pt found chanting in the room.  She answers all questions with "ok."    Level 5 Caveat for AMS and psychiatric disorder  [facility]  Initially not answering questions.  [facility] has had a significant change in patient's mood and behavior.  They report that 3 weeks ago she quit her job and has become aggressive.  They report she has not been sleeping and has been extremely paranoid.  Family reports patient responding to internal stimuli.  She has had threatening behavior and grandmother has found patient standing over her during her sleep.  [facility] patient's paranoia.  She does meet inpatient criteria but was transferred here to the emergency department for IVC.    The history is provided by the patient and medical records. No language interpreter was used.    Past Medical History:  Diagnosis Date  . Medical history non-contributory     Patient Active Problem List   Diagnosis Date Noted  . Encounter for initial prescription of contraceptive pills 07/10/2017  . Dysmenorrhea, unspecified 07/10/2017  . Labor and delivery indication for care or intervention 06/13/2014  . Subchorionic hematoma in second trimester 01/14/2014  . Placental abnormality in first trimester 01/14/2014  . Chlamydia infection affecting pregnancy in first trimester, antepartum 11/25/2013  . Supervision of normal pregnancy 11/04/2013    Past Surgical History:  Procedure Laterality Date  . KNEE ARTHROSCOPY W/ ACL RECONSTRUCTION       OB History    Gravida  1   Para  1   Term  1    Preterm      AB      Living  1     SAB      TAB      Ectopic      Multiple  0   Live Births  1            Home Medications    Prior to Admission medications   Medication Sig Start Date End Date Taking? Authorizing Provider  ALPRAZolam (XANAX) 0.25 MG tablet Take 1 tablet (0.25 mg total) by mouth at bedtime as needed for anxiety. 03/13/18   Zadie RhineWickline, Donald, MD  norgestimate-ethinyl estradiol (ORTHO-CYCLEN,SPRINTEC,PREVIFEM) 0.25-35 MG-MCG tablet Take 1 tablet by mouth daily. 07/10/17   [place], Betty G, MD    Family History Family History  Problem Relation Age of Onset  . Diabetes Paternal Grandmother   . Kidney disease Father   . Thyroid disease Mother   . Cancer Neg Hx     Social History Social History   Tobacco Use  . Smoking status: Former Smoker    Types: Cigarettes  . Smokeless tobacco: Never Used  Substance Use Topics  . Alcohol use: Yes  . Drug use: No     Allergies   Patient has no known allergies.   Review of Systems Review of Systems  Unable to perform ROS: Psychiatric disorder     Physical Exam Updated Vital Signs BP 129/86 (BP Location: Right Arm)   Pulse (!) 115   Temp 99.4  F (37.4 C) (Oral)   Resp (!) 22   SpO2 98%   Physical Exam Vitals signs and nursing note reviewed.  Constitutional:      General: She is not in acute distress.    Appearance: She is well-developed.     Comments: Pt pacing in the room and chanting Answers every question with "OK"  HENT:     Head: Normocephalic.  Eyes:     General: No scleral icterus.    Conjunctiva/sclera: Conjunctivae normal.  Neck:     Musculoskeletal: Normal range of motion.  Cardiovascular:     Rate and Rhythm: Tachycardia present.  Pulmonary:     Effort: Pulmonary effort is normal.  Musculoskeletal: Normal range of motion.  Skin:    General: Skin is warm and dry.  Neurological:     Mental Status: She is alert.     Comments: Pt follows commands Moves extremities without  ataxia Normal gait  Psychiatric:        Behavior: Behavior is agitated.     Comments: Wide-eyed, intense eye contact      ED Treatments / Results  Labs (all labs ordered are listed, but only abnormal results are displayed) Labs Reviewed  COMPREHENSIVE METABOLIC PANEL - Abnormal; Notable for the following components:      Result Value   Potassium 3.4 (*)    CO2 21 (*)    Glucose, Bld 136 (*)    All other components within normal limits  ACETAMINOPHEN LEVEL - Abnormal; Notable for the following components:   Acetaminophen (Tylenol), Serum <10 (*)    All other components within normal limits  RAPID URINE DRUG SCREEN, HOSP PERFORMED - Abnormal; Notable for the following components:   Tetrahydrocannabinol POSITIVE (*)    All other components within normal limits  ETHANOL  SALICYLATE LEVEL  CBC  I-STAT BETA HCG BLOOD, ED ([facility], [facility], [facility] ONLY)    Procedures Procedures (including critical care time)  Medications Ordered in ED Medications  potassium chloride SA (K-DUR) CR tablet 40 mEq (has no administration in time range)  LORazepam (ATIVAN) injection 2 mg (has no administration in time range)  ALPRAZolam (XANAX) tablet 0.25 mg (has no administration in time range)  norgestimate-ethinyl estradiol (ORTHO-CYCLEN) 0.25-35 MG-MCG tablet 1 tablet (has no administration in time range)     Initial Impression / Assessment and Plan / ED Course  I have reviewed the triage vital signs and the nursing notes.  Pertinent labs & imaging results that were available during my care of the patient were reviewed by me and considered in my medical decision making (see chart for details).  Clinical Course as of Jul 27 656  Mon Jul 28, 2018  1191 Potassium ordered  Potassium(!): 3.4 [HM]  0656 Patient is tachycardic but appears anxious.  Pulse Rate(!): 115 [HM]    Clinical Course User Index [HM] Amera Banos, Jarrett Soho, PA-C       Patient here from [facility] after meeting criteria for inpatient.   She will need IVC.  Patient is found chanting and pacing in the room.  She is clearly very anxious.  No history can be obtained from the patient as she answers every question with "okay."  Patient is tachycardic with mild tachypnea but no respiratory distress.  Afebrile.  Labs are reassuring.  Mild hypokalemia is replaced.  Ativan given to help calm pt.   IVC paperwork completed; home meds ordered.    Final Clinical Impressions(s) / ED Diagnoses   Final diagnoses:  Psychosis, unspecified psychosis  type [facility](HCC)    ED Discharge Orders    None       Destani Wamser, Boyd KerbsHannah, PA-C 07/28/18 [id number]    Ward, Layla MawKristen N, DO 07/28/18 [id number]

## 2018-07-28 NOTE — H&P (Signed)
Behavioral Health Medical Screening Exam  Laurie Hooper is an 28 y.o. female that presents to [facility] accompanied by her mother, grandmother and grandfather.  She has not been sleeping and has been extremely paranoid and responding to internal stimuli.  She staying with her greatgrandmother.  The great grandmother reports that she has been extremely threatening in her recent behavior. She reports that the pt has been observed standing over her in her sleep and accusing the great grandmother of things that are not true. Patient was pleasant and laying in bed on approach. She is AOx4.  Her responses to questions are yes and no, example, why are you here tonight, pts response "yes".    Total Time spent with patient: 45 minutes  Psychiatric Specialty Exam: Physical Exam  ROS  There were no vitals taken for this visit.There is no height or weight on file to calculate BMI.  General Appearance: Casual  Eye Contact:  Fair  Speech:  Normal Rate and Slow  Volume:  Normal  Mood:  NA and Euthymic  Affect:  Restricted  Thought Process:  Disorganized  Orientation:  Full (Time, Place, and Person)  Thought Content:  Illogical, Hallucinations: Visual and Paranoid Ideation  Suicidal Thoughts:  No  Homicidal Thoughts:  No  Memory:  NA  Judgement:  Impaired  Insight:  Shallow  Psychomotor Activity:  Normal  Concentration: Concentration: Fair  Recall:  [place]  Language: Good  Akathisia:  NA  Handed:  Right  AIMS (if indicated):     Assets:  Social Support  Sleep:       Musculoskeletal: Strength & Muscle Tone: within normal limits Gait & Station: normal Patient leans: N/A  There were no vitals taken for this visit.  Recommendations: Recommend inpatient treatment for evaluation and stabilization.  Deloria Lair, NP 07/28/2018, 4:17 AM

## 2018-07-28 NOTE — ED Notes (Signed)
Pt told that she needed to change into purple scrubs and then we would go over to purple zone. Once purple scrubs were brought into room, pt starts moaning and screaming and ignoring all staff.  Security at bedside

## 2018-07-29 DIAGNOSIS — F319 Bipolar disorder, unspecified: Secondary | ICD-10-CM

## 2018-07-29 LAB — HEMOGLOBIN A1C
Hgb A1c MFr Bld: 5.3 % (ref 4.8–5.6)
Mean Plasma Glucose: 105.41 mg/dL

## 2018-07-29 LAB — LIPID PANEL
Cholesterol: 171 mg/dL (ref 0–200)
HDL: 31 mg/dL — ABNORMAL LOW (ref >40)
LDL Cholesterol: 123 mg/dL — ABNORMAL HIGH (ref 0–99)
Total CHOL/HDL Ratio: 5.5 RATIO
Triglycerides: 87 mg/dL (ref <150)
VLDL: 17 mg/dL (ref 0–40)

## 2018-07-29 NOTE — Progress Notes (Signed)
Adult Psychoeducational Group Note  Date:  07/29/2018 Time:  11:37 PM  Group Topic/Focus:  Wrap-Up Group:   The focus of this group is to help patients review their daily goal of treatment and discuss progress on daily workbooks.  Participation Level:  Active  Participation Quality:  Appropriate  Affect:  Appropriate  Cognitive:  Appropriate  Insight: Appropriate  Engagement in Group:  Engaged  Modes of Intervention:  Discussion  Additional Comments:   Patient attended group and said his day was a 7. His goal for today was to think more clearly.  Patient said he had no thoughts of hurting herself or anyone else.     Oliviarose Punch W Brixton Franko 0/37/0488, 11:37 PM

## 2018-07-29 NOTE — Progress Notes (Signed)
Recreation Therapy Notes  Date: 07/29/2018 Time: 10:00 am Location: 500 hall   Group Topic: Healthy vs Unhealthy Coping Skills  Goal Area(s) Addresses:  Patient will work on worksheet on Healthy vs Unhealthy Coping Skills. Patient will follow directions on first prompt.  Behavioral Response: Appropriate  Intervention: Worksheet  Activity:  Staff on 500 hall were provided with a worksheet on Healthy vs Unhealthy Coping Skills. Staff was instructed to give it to the patients and have them work on it in place of Recreation Therapy Group. Staff was also given 2 coloring sheets and 2 word searches and were given the option to give them out.  Education:  Ability to follow Directions, Change of thought processes Discharge Planning, Goal Planning.   Education Outcome: Acknowledges education/In group clarification offered  Clinical Observations/Feedback: . Due to COVID-19, guidelines group was not held. Group members were provided a learning activity worksheet to work on the topic and above-stated goals. LRT is available to answer any questions patient may have regarding the worksheet.  Adrienne Delay L Amariyah Bazar, LRT/CTRS         Laurie Hooper L Laurie Hooper 07/29/2018 10:08 AM 

## 2018-07-29 NOTE — Plan of Care (Signed)
Progress note  D: pt found in bed; compliant with medication administration. Pt denies any physical pain. Pt is visibly pensive, anxious, and apprehensive. Pt denies si/hi/ah/vh and verbally agrees to approach staff if these become apparent or before harming himself/others while at [place]: Pt provided support and encouragement. Pt given medication per protocol and standing orders. Q35m safety checks implemented and continued.  R: Pt safe on the unit. Will continue to monitor.  Pt progressing in the following metrics  Problem: Education: Goal: Knowledge of [place] [facility] Education information/materials will improve Outcome: Progressing Goal: Emotional status will improve Outcome: Progressing Goal: Mental status will improve Outcome: Progressing Goal: Verbalization of understanding the information provided will improve Outcome: Progressing

## 2018-07-29 NOTE — Progress Notes (Signed)
Recreation Therapy Notes  INPATIENT RECREATION THERAPY ASSESSMENT  Patient Details Name: Laurie Hooper MRN: [id number] DOB: 1990/09/06 Today's Date: 07/29/2018   Comments:  Patient appears paranoid, and questioning "Who can I trust in my family". Patient was observed by her grandparents telling her daughter she was "brainwashing" her grandma. Patient was diagnosed with bipolar disorder in college but never received treatment. Patient is stated to have a boyfriend but her grandparents state he is not supportive of her,       Information Obtained From: Chart Review  Patient Presentation: Oriented(Patient was only able to state "no" or "I love my family")  Reason for Admission (Per Patient): Patient Unable to Identify  Patient Stressors: Other (Comment)(lack of treatment, no medications)  Coping Skills:   Impulsivity, Arguments(quit job, manic, lack of sleep)  [place] of Residence:  [place]  Patient Goal for Hospitalization:  group participation  Current SI (including self-harm):  No  Current HI:  No  Current AVH: No(Patient denies AVH but pateint reportedly appears to be responding to some internal stimuli)  Staff Intervention Plan: Group Attendance, Collaborate with Interdisciplinary Treatment Team  Consent to Intern Participation: N/A  Tomi Likens, LRT/CTRS  [place] 07/29/2018, 2:02 PM

## 2018-07-29 NOTE — Progress Notes (Signed)
Nursing Progress Note: 7p-7a D: Pt currently presents with a pleasant/anxious/flat/minimal affect and behavior. Interacting minimally with the milieu. Pt reports good sleep during the previous night with current medication regimen.  A: Pt provided with medications per providers orders. Pt's labs and vitals were monitored throughout the night. Pt supported emotionally and encouraged to express concerns and questions. Pt educated on medications.  R: Pt's safety ensured with 15 minute and environmental checks. Pt currently denies SI, HI, and endorses AVH. Pt verbally contracts to seek staff if SI,HI, or AVH occurs and to consult with staff before acting on any harmful thoughts. Will continue to monitor.

## 2018-07-29 NOTE — Progress Notes (Signed)
D: Pt denies SI/HI/AVH. Pt is pleasant and cooperative. Pt denied AVH but appeared to be responding to internal stimuli at times. Pt paranoid on the unit this evening. Pt very guarded and kept to self in the room.   A: Pt was offered support and encouragement. Pt was given scheduled medications. Pt was encourage to attend groups. Q 15 minute checks were done for safety.   R: safety maintained on unit.

## 2018-07-29 NOTE — Progress Notes (Signed)

## 2018-07-29 NOTE — Progress Notes (Signed)
[facility]  07/29/2018 1:34 PM Laurie AngelicaKhadejah C Hooper  MRN:  [id number] Subjective: Patient is a 28 year old female admitted on 07/28/2018 after developing mood instability, some symptoms suggestive of psychosis, bizarre behavior and a previous history of bipolar-like symptoms.  Objective: Patient is a 28 year old female who is seen in follow-up.  The patient was admitted yesterday after appearing psychotic, not sleeping, and appearing very much so as though she had bipolar disorder.  Collateral information had found out that the patient had had several episodes recently of inability to sleep, and that an episode similar to this had occurred several years ago when she was in college.  Reportedly she had been previously told that she had bipolar disorder.  She had apparently had manic symptoms and insomnia similar to this in college.  During the interview today she denied that that was true.  On admission she was started on Tegretol 200 mg p.o. twice daily and Risperdal 4 mg p.o. nightly.  She is mildly sedated today, but continues to be labile.  She is mildly tangential, and at times circumstantial.  She denied any auditory or visual hallucinations.  She denied any history of excessive spending or euphoria.  She denied any family history of any psychiatric disorders.  On admission her blood sugar was mildly elevated at 136, the rest of her electrolytes were normal.  Her CBC with differential was all normal.  Thyroid was within normal limits and beta-hCG was negative.  Drug screen was positive for marijuana.  She does have some delusional thinking and believing that she is pregnant.  When we discussed the fact that she had a negative pregnancy test she still held onto some degree of believe that she was pregnant.  Her blood pressure stable this morning but she is mildly tachycardic with a rate of 109.  She is afebrile.  Principal Problem: <principal problem not specified> Diagnosis: Active Problems:  Bipolar depression (HCC)  Total Time spent with patient: 20 minutes  Past Psychiatric History: See admission H&P  Past Medical History:  Past Medical History:  Diagnosis Date  . Medical history non-contributory     Past Surgical History:  Procedure Laterality Date  . KNEE ARTHROSCOPY W/ ACL RECONSTRUCTION     Family History:  Family History  Problem Relation Age of Onset  . Diabetes Paternal Grandmother   . Kidney disease Father   . Thyroid disease Mother   . Cancer Neg Hx    Family Psychiatric  History: See admission H&P Social History:  Social History   Substance and Sexual Activity  Alcohol Use Not Currently     Social History   Substance and Sexual Activity  Drug Use No    Social History   Socioeconomic History  . Marital status: Single    Spouse name: Not on file  . Number of children: Not on file  . Years of education: Not on file  . Highest education level: Not on file  Occupational History  . Not on file  Social Needs  . Financial resource strain: Not on file  . Food insecurity    Worry: Not on file    Inability: Not on file  . Transportation needs    Medical: Not on file    Non-medical: Not on file  Tobacco Use  . Smoking status: Former Smoker    Types: Cigarettes  . Smokeless tobacco: Never Used  Substance and Sexual Activity  . Alcohol use: Not Currently  . Drug use: No  . Sexual  activity: Yes    Birth control/protection: None  Lifestyle  . Physical activity    Days per week: 3 days    Minutes per session: 30 min  . Stress: Not on file  Relationships  . Social Herbalist on phone: Not on file    Gets together: Not on file    Attends religious service: Not on file    Active member of club or organization: Not on file    Attends meetings of clubs or organizations: Not on file    Relationship status: Not on file  Other Topics Concern  . Not on file  Social History Narrative  . Not on file   Additional Social History:                          Sleep: Fair  Appetite:  Fair  Current Medications: Current Facility-Administered Medications  Medication Dose Route Frequency Provider Last Rate Last Dose  . acetaminophen (TYLENOL) tablet 650 mg  650 mg Oral Q6H PRN Derrill Center, NP      . alum & mag hydroxide-simeth (MAALOX/MYLANTA) 200-200-20 MG/5ML suspension 30 mL  30 mL Oral Q4H PRN Derrill Center, NP      . carbamazepine (TEGRETOL) chewable tablet 200 mg  200 mg Oral BID Johnn Hai, MD   200 mg at 07/29/18 0759  . hydrOXYzine (ATARAX/VISTARIL) tablet 25 mg  25 mg Oral TID PRN Derrill Center, NP      . OLANZapine zydis (ZYPREXA) disintegrating tablet 10 mg  10 mg Oral Q8H PRN Sharma Covert, MD       And  . LORazepam (ATIVAN) tablet 1 mg  1 mg Oral PRN Sharma Covert, MD       And  . ziprasidone (GEODON) injection 20 mg  20 mg Intramuscular PRN Sharma Covert, MD      . magnesium hydroxide (MILK OF MAGNESIA) suspension 30 mL  30 mL Oral Daily PRN Derrill Center, NP      . risperiDONE (RISPERDAL) tablet 4 mg  4 mg Oral QHS Johnn Hai, MD   4 mg at 07/28/18 2116  . temazepam (RESTORIL) capsule 30 mg  30 mg Oral QHS Johnn Hai, MD   30 mg at 07/28/18 2116  . traZODone (DESYREL) tablet 50 mg  50 mg Oral QHS PRN Derrill Center, NP        Lab Results:  Results for orders placed or performed during the hospital encounter of 07/28/18 (from the past 48 hour(s))  TSH     Status: None   Collection Time: 07/28/18  6:30 PM  Result Value Ref Range   TSH 1.974 0.350 - 4.500 uIU/mL    Comment: Performed by a 3rd Generation assay with a functional sensitivity of <=0.01 uIU/mL. Performed at [facility], [place] [address] [place], [place] [address]   Lipid panel     Status: Abnormal   Collection Time: 07/29/18  6:32 AM  Result Value Ref Range   Cholesterol 171 0 - 200 mg/dL   Triglycerides 87 <150 mg/dL   HDL 31 (L) >40 mg/dL   Total CHOL/HDL Ratio 5.5 RATIO   VLDL  17 0 - 40 mg/dL   LDL Cholesterol 123 (H) 0 - 99 mg/dL    Comment:        Total Cholesterol/HDL:CHD Risk Coronary Heart Disease Risk Table  Men   Women  1/2 Average Risk   3.4   3.3  Average Risk       5.0   4.4  2 X Average Risk   9.6   7.1  3 X Average Risk  23.4   11.0        Use the calculated Patient Ratio above and the CHD Risk Table to determine the patient's CHD Risk.        ATP III CLASSIFICATION (LDL):  <100     mg/dL   Optimal  161-096100-129  mg/dL   Near or Above                    Optimal  130-159  mg/dL   Borderline  [id number]  mg/dL   High  >811>190     mg/dL   Very High Performed at [facility], [address] [place], [place] [address]   Hemoglobin A1c     Status: None   Collection Time: 07/29/18  6:32 AM  Result Value Ref Range   Hgb A1c MFr Bld 5.3 4.8 - 5.6 %    Comment: (NOTE) Pre diabetes:          5.7%-6.4% Diabetes:              >6.4% Glycemic control for   <7.0% adults with diabetes    Mean Plasma Glucose 105.41 mg/dL    Comment: Performed at [facility], [address] [place], [place] [address]    Blood Alcohol level:  Lab Results  Component Value Date   [facility] <10 07/28/2018   ETH <10 03/13/2018    Metabolic Disorder Labs: Lab Results  Component Value Date   HGBA1C 5.3 07/29/2018   MPG 105.41 07/29/2018   No results found for: PROLACTIN Lab Results  Component Value Date   CHOL 171 07/29/2018   TRIG 87 07/29/2018   HDL 31 (L) 07/29/2018   CHOLHDL 5.5 07/29/2018   VLDL 17 07/29/2018   LDLCALC 123 (H) 07/29/2018    Physical Findings: AIMS:  , ,  ,  ,    CIWA:    COWS:     Musculoskeletal: Strength & Muscle Tone: within normal limits Gait & Station: normal Patient leans: N/A  Psychiatric Specialty Exam: Physical Exam  Nursing note and vitals reviewed. Constitutional: She is oriented to person, place, and time. She appears well-developed and well-nourished.  HENT:  Head: Normocephalic  and atraumatic.  Respiratory: Effort normal.  Neurological: She is alert and oriented to person, place, and time.    ROS  Blood pressure 95/83, pulse (!) 109, temperature 98.7 F (37.1 C), height 6\' 2"  (1.88 m), weight 98.4 kg, SpO2 100 %.Body mass index is 27.86 kg/m.  General Appearance: Disheveled  Eye Contact:  Fair  Speech:  Normal Rate  Volume:  Decreased  Mood:  Anxious, Depressed and Dysphoric  Affect:  Labile  Thought Process:  Goal Directed and Descriptions of Associations: Tangential  Orientation:  Full (Time, Place, and Person)  Thought Content:  Delusions and Tangential  Suicidal Thoughts:  Yes.  without intent/plan  Homicidal Thoughts:  No  Memory:  Immediate;   Fair Recent;   Fair Remote;   Fair  Judgement:  Impaired  Insight:  Fair  Psychomotor Activity:  Increased  Concentration:  Concentration: Fair and Attention Span: Fair  Recall:  F[employer]Fair  Fund of Knowledge:  Fair  Language:  Good  Akathisia:  Negative  Handed:  Right  AIMS (if indicated):     Assets:  Desire for Improvement Resilience  ADL's:  Intact  Cognition:  WNL  Sleep:  Number of Hours: 6.25     Treatment Plan Summary: Daily contact with patient to assess and evaluate symptoms and progress in treatment, Medication management and Plan : Patient is seen and examined.  Patient is a 28 year old female who is seen in follow-up.   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  At least review the chart seems as though she is a bit better.  She remains labile, mildly tangential, little disorganized and still with pregnancy delusions.  She was just started on the Tegretol and Risperdal last night.  I am going to leave those in place for now.  Hopefully this will slow her down a bit and stabilize her mood and we can get some more history  tomorrow.  We will need to contact her family for collateral information. 1.  Continue Tegretol 200 mg p.o. twice daily for mood stability. 2.  Continue hydroxyzine 25 mg p.o. 3 times daily as needed anxiety. 3.  Continue agitation protocol PRN including Zyprexa, lorazepam or ziprasidone. 4.  Continue Risperdal 4 mg p.o. nightly for mood stability and psychosis. 5.  Continue temazepam 30 mg p.o. nightly for insomnia. 6.  Continue trazodone 50 mg p.o. nightly as needed insomnia. 7.  Collect collateral information from her family. 8.  Disposition planning-in progress.  Antonieta PertGreg Lawson Kellie Chisolm, MD 07/29/2018, 1:34 PM

## 2018-07-30 LAB — PROLACTIN: Prolactin: 107.9 ng/mL — ABNORMAL HIGH (ref 4.8–23.3)

## 2018-07-30 MED ORDER — BENZTROPINE MESYLATE 0.5 MG PO TABS
0.5000 mg | ORAL_TABLET | Freq: Two times a day (BID) | ORAL | Status: DC
  Administered 2018-07-30 – 2018-07-31 (×3): 0.5 mg via ORAL
  Filled 2018-07-30: qty 1
  Filled 2018-07-30: qty 14
  Filled 2018-07-30: qty 1
  Filled 2018-07-30 (×2): qty 14
  Filled 2018-07-30 (×3): qty 1
  Filled 2018-07-30: qty 14

## 2018-07-30 MED ORDER — RISPERIDONE 2 MG PO TABS
2.0000 mg | ORAL_TABLET | Freq: Every day | ORAL | Status: DC
  Administered 2018-07-30 – 2018-07-31 (×2): 2 mg via ORAL
  Filled 2018-07-30 (×5): qty 1

## 2018-07-30 NOTE — [facility] Group Notes (Signed)
Occupational Therapy Group Note  Date:  07/30/2018 Time:  8:02 PM  Group Topic/Focus:  Leisure Group  Participation Level:  Active  Participation Quality:  Appropriate  Affect:  Flat  Cognitive:  Alert  Insight: Improving  Engagement in Group:  Engaged  Modes of Intervention:  Activity, Discussion, Education and Socialization  Additional Comments:    S: Stress management for me is reading and praying to God  O: OT group focus on leisure this date, while incorporating coping skills to ensure understanding. Pts to play game of Uno: and name a struggle they're facing, a communication skill, something they like about yourself, stress management, and a healthy way to manage anger per certain cards played. Pt also assessed for attention, ability to follow rules, and temperament with rules.   A: Pt presents with flat affect, engaged and participatory throughout session. Pt is overall alert and appropriately following rules throughout session. She at times appeared mildly agitated by other members displaying manic behavior, but overall is appropriate. She shares appropriate skills for stress management this date as well.  P: OT group will be x1 per week while pt inpatient   Zenovia Jarred, MSOT, OTR/L [place] Office: [place] 07/30/2018, 8:02 PM

## 2018-07-30 NOTE — Progress Notes (Signed)
[facility] MD Progress Note  07/30/2018 10:37 AM Laurie Hooper  MRN:  [id number] Subjective:   Patient is cordial, compliant and engages in interview process but quickly becomes tearful, she states she is here simply because her family does not like her boyfriend becomes tearful about this she lacks insight into the fact this seems to be on exacerbation and underlying bipolar type condition that is been somewhat dormant since her initial presentation. She has no thoughts of harming self or others, tearful anxious, no insight into her recent bizarre behaviors and overall showing a little bit of improvement without this insight.  No EPS or TD. Principal Problem: Bipolar exacerbation with recent psychosis initially thought to be manic but showing mixed symptomatology Diagnosis: Active Problems:   Bipolar depression ([place])  Total Time spent with patient: 20 minutes  Past Psychiatric History: As per HPI 1 prior episode not requiring hospitalization  Past Medical History:  Past Medical History:  Diagnosis Date  . Medical history non-contributory     Past Surgical History:  Procedure Laterality Date  . KNEE ARTHROSCOPY W/ ACL RECONSTRUCTION     Family History:  Family History  Problem Relation Age of Onset  . Diabetes Paternal Grandmother   . Kidney disease Father   . Thyroid disease Mother   . Cancer Neg Hx    Family Psychiatric  History: Unknown to patient Social History:  Social History   Substance and Sexual Activity  Alcohol Use Not Currently     Social History   Substance and Sexual Activity  Drug Use No    Social History   Socioeconomic History  . Marital status: Single    Spouse name: Not on file  . Number of children: Not on file  . Years of education: Not on file  . Highest education level: Not on file  Occupational History  . Not on file  Social Needs  . Financial resource strain: Not on file  . Food insecurity    Worry: Not on file    Inability: Not on  file  . Transportation needs    Medical: Not on file    Non-medical: Not on file  Tobacco Use  . Smoking status: Former Smoker    Types: Cigarettes  . Smokeless tobacco: Never Used  Substance and Sexual Activity  . Alcohol use: Not Currently  . Drug use: No  . Sexual activity: Yes    Birth control/protection: None  Lifestyle  . Physical activity    Days per week: 3 days    Minutes per session: 30 min  . Stress: Not on file  Relationships  . Social Herbalist on phone: Not on file    Gets together: Not on file    Attends religious service: Not on file    Active member of club or organization: Not on file    Attends meetings of clubs or organizations: Not on file    Relationship status: Not on file  Other Topics Concern  . Not on file  Social History Narrative  . Not on file   Additional Social History:                         Sleep: Good  Appetite:  Good  Current Medications: Current Facility-Administered Medications  Medication Dose Route Frequency Provider Last Rate Last Dose  . acetaminophen (TYLENOL) tablet 650 mg  650 mg Oral Q6H PRN Derrill Center, NP      .  alum & mag hydroxide-simeth (MAALOX/MYLANTA) 200-200-20 MG/5ML suspension 30 mL  30 mL Oral Q4H PRN Oneta RackLewis, Tanika N, NP      . carbamazepine (TEGRETOL) chewable tablet 200 mg  200 mg Oral BID Malvin JohnsFarah, Naileah Karg, MD   200 mg at 07/30/18 1006  . hydrOXYzine (ATARAX/VISTARIL) tablet 25 mg  25 mg Oral TID PRN Oneta RackLewis, Tanika N, NP      . OLANZapine zydis (ZYPREXA) disintegrating tablet 10 mg  10 mg Oral Q8H PRN Antonieta Pertlary, Greg Lawson, MD       And  . LORazepam (ATIVAN) tablet 1 mg  1 mg Oral PRN Antonieta Pertlary, Greg Lawson, MD       And  . ziprasidone (GEODON) injection 20 mg  20 mg Intramuscular PRN Antonieta Pertlary, Greg Lawson, MD      . magnesium hydroxide (MILK OF MAGNESIA) suspension 30 mL  30 mL Oral Daily PRN Oneta RackLewis, Tanika N, NP      . risperiDONE (RISPERDAL) tablet 2 mg  2 mg Oral Daily Malvin JohnsFarah, Areona Homer, MD      .  risperiDONE (RISPERDAL) tablet 4 mg  4 mg Oral QHS Malvin JohnsFarah, Brantley Wiley, MD   4 mg at 07/29/18 2129  . temazepam (RESTORIL) capsule 30 mg  30 mg Oral QHS Malvin JohnsFarah, Terique Kawabata, MD   30 mg at 07/29/18 2129  . traZODone (DESYREL) tablet 50 mg  50 mg Oral QHS PRN Oneta RackLewis, Tanika N, NP        Lab Results:  Results for orders placed or performed during the hospital encounter of 07/28/18 (from the past 48 hour(s))  TSH     Status: None   Collection Time: 07/28/18  6:30 PM  Result Value Ref Range   TSH 1.974 0.350 - 4.500 uIU/mL    Comment: Performed by a 3rd Generation assay with a functional sensitivity of <=0.01 uIU/mL. Performed at [facility], [address] [place], [place] [address]   Lipid panel     Status: Abnormal   Collection Time: 07/29/18  6:32 AM  Result Value Ref Range   Cholesterol 171 0 - 200 mg/dL   Triglycerides 87 <191<150 mg/dL   HDL 31 (L) >47>40 mg/dL   Total CHOL/HDL Ratio 5.5 RATIO   VLDL 17 0 - 40 mg/dL   LDL Cholesterol 829123 (H) 0 - 99 mg/dL    Comment:        Total Cholesterol/HDL:CHD Risk Coronary Heart Disease Risk Table                     Men   Women  1/2 Average Risk   3.4   3.3  Average Risk       5.0   4.4  2 X Average Risk   9.6   7.1  3 X Average Risk  23.4   11.0        Use the calculated Patient Ratio above and the CHD Risk Table to determine the patient's CHD Risk.        ATP III CLASSIFICATION (LDL):  <100     mg/dL   Optimal  562-130100-129  mg/dL   Near or Above                    Optimal  130-159  mg/dL   Borderline  865-784160-189  mg/dL   High  >696>190     mg/dL   Very High Performed at [facility], [address] [place], [place] [address]   Prolactin  Status: Abnormal   Collection Time: 07/29/18  6:32 AM  Result Value Ref Range   Prolactin 107.9 (H) 4.8 - 23.3 ng/mL    Comment: (NOTE) Performed At: [facility] [address] [place], [place] [id number] Jolene SchimkeNagendra Sanjai MD WU:[id number]:[phone]   Hemoglobin A1c     Status:  None   Collection Time: 07/29/18  6:32 AM  Result Value Ref Range   Hgb A1c MFr Bld 5.3 4.8 - 5.6 %    Comment: (NOTE) Pre diabetes:          5.7%-6.4% Diabetes:              >6.4% Glycemic control for   <7.0% adults with diabetes    Mean Plasma Glucose 105.41 mg/dL    Comment: Performed at [facility], [address] [place], [place] [address]    Blood Alcohol level:  Lab Results  Component Value Date   [facility] 07/28/2018   ETH <10 03/13/2018    Metabolic Disorder Labs: Lab Results  Component Value Date   HGBA1C 5.3 07/29/2018   MPG 105.41 07/29/2018   Lab Results  Component Value Date   PROLACTIN 107.9 (H) 07/29/2018   Lab Results  Component Value Date   CHOL 171 07/29/2018   TRIG 87 07/29/2018   HDL 31 (L) 07/29/2018   CHOLHDL 5.5 07/29/2018   VLDL 17 07/29/2018   LDLCALC 123 (H) 07/29/2018    Physical Findings: AIMS:  , ,  ,  ,    CIWA:    COWS:     Musculoskeletal: Strength & Muscle Tone: within normal limits Gait & Station: normal Patient leans: N/A  Psychiatric Specialty Exam: Physical Exam  ROS  Blood pressure 106/69, pulse (!) 136, temperature 97.6 F (36.4 C), height 6\' 2"  (1.88 m), weight 98.4 kg, SpO2 100 %.Body mass index is 27.86 kg/m.  General Appearance: Casual  Eye Contact:  Good  Speech:  Clear and Coherent  Volume:  Normal  Mood:  Anxious and Dysphoric  Affect:  Tearful  Thought Process:  Goal Directed and Descriptions of Associations: Circumstantial  Orientation:  Full (Time, Place, and Person)  Thought Content:  Logical  Suicidal Thoughts:  No  Homicidal Thoughts:  No  Memory:  Immediate;   Fair  Judgement:  Intact  Insight:  Fair  Psychomotor Activity:  Normal  Concentration:  Concentration: Good  Recall:  Fair  Fund of Knowledge:  Fair  Language:  Fair3  Akathisia:  Negative  Handed:  Right  AIMS (if indicated):     Assets:  Physical Health Resilience  ADL's:  Intact  Cognition:  WNL  Sleep:  Number of  Hours: 6.75     Treatment Plan Summary: Daily contact with patient to assess and evaluate symptoms and progress in treatment, Medication management and Plan Continue cognitive therapy continue reality-based therapy med and illness education escalate Risperdal to a total of 6 mg day continue carbamazepine check level tomorrow add benztropine continue reality-based therapies as mentioned and again she may never recall of the recent bizarre behaviors but hopefully will gain some insight into her condition  Carson Meche, MD 07/30/2018, 10:37 AM
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
## 2018-07-30 NOTE — Progress Notes (Signed)
D: Pt denies SI/HI/AVH. Pt is pleasant and cooperative. Pt stated she was feeling better.  A: Pt was offered support and encouragement. Pt was given scheduled medications. Pt was encourage to attend groups. Q 15 minute checks were done for safety.  R:Pt attends groups and interacts well with peers and staff. Pt is taking medication. Pt has no complaints.Pt receptive to treatment and safety maintained on unit.  Problem: Education: Goal: Emotional status will improve Outcome: Progressing   Problem: Education: Goal: Mental status will improve Outcome: Progressing

## 2018-07-30 NOTE — [facility] Suicide Risk Assessment (Signed)
[name] INPATIENT:  Family/Significant Other Suicide Prevention Education  Suicide Prevention Education:  Contact Attempts: Pt's mother Laurie Hooper) and pt's grandmother Laurie Hooper),  has been identified by the patient as the family member/significant other with whom the patient will be residing, and identified as the person(s) who will aid the patient in the event of a mental health crisis.  With written consent from the patient, two attempts were made to provide suicide prevention education, prior to and/or following the patient's discharge.  We were unsuccessful in providing suicide prevention education.  A suicide education pamphlet was given to the patient to share with family/significant other.  Date and time of first attempt: 07/30/2018 @ 1:35pm ; vm was left Date and time of second attempt:   Laurie Hooper 07/30/2018, 2:08 PM

## 2018-07-30 NOTE — Progress Notes (Signed)
Recreation Therapy Notes  Date: 07/30/2018 Time: 10:00 am Location: 500 hall   Group Topic: Introduction into Anxiety  Goal Area(s) Addresses:  Patient will work on worksheet on Introduction into Anxiety. Patient will follow directions on first prompt.  Behavioral Response: Appropriate  Intervention: Worksheet  Activity:  Staff on 500 hall were provided with a worksheet on Introduction into Anxiety. Staff was instructed to give it to the patients and have them work on it in place of Recreation Therapy Group. Staff was also given 2 coloring sheets and 2 word searches and were given the option to give them out.  Education:  Ability to follow Directions, Change of thought processes Discharge Planning, Goal Planning.   Education Outcome: Acknowledges education/In group clarification offered  Clinical Observations/Feedback: . Due to COVID-19, guidelines group was not held. Group members were provided a learning activity worksheet to work on the topic and above-stated goals. LRT is available to answer any questions patient may have regarding the worksheet.  Maurilio Puryear L Vishal Sandlin, LRT/CTRS        Missie Gehrig L Sussie Minor 07/30/2018 2:27 PM 

## 2018-07-30 NOTE — Tx Team (Signed)
Interdisciplinary Treatment and Diagnostic Plan Update  07/30/2018 Time of Session: 09:08am Laurie Hooper MRN: [id number]  Principal Diagnosis: <principal problem not specified>  Secondary Diagnoses: Active Problems:   Bipolar depression ([place])   Current Medications:  Current Facility-Administered Medications  Medication Dose Route Frequency Provider Last Rate Last Dose  . acetaminophen (TYLENOL) tablet 650 mg  650 mg Oral Q6H PRN Derrill Center, NP      . alum & mag hydroxide-simeth (MAALOX/MYLANTA) 200-200-20 MG/5ML suspension 30 mL  30 mL Oral Q4H PRN Derrill Center, NP      . carbamazepine (TEGRETOL) chewable tablet 200 mg  200 mg Oral BID Johnn Hai, MD   200 mg at 07/29/18 2129  . hydrOXYzine (ATARAX/VISTARIL) tablet 25 mg  25 mg Oral TID PRN Derrill Center, NP      . OLANZapine zydis (ZYPREXA) disintegrating tablet 10 mg  10 mg Oral Q8H PRN Sharma Covert, MD       And  . LORazepam (ATIVAN) tablet 1 mg  1 mg Oral PRN Sharma Covert, MD       And  . ziprasidone (GEODON) injection 20 mg  20 mg Intramuscular PRN Sharma Covert, MD      . magnesium hydroxide (MILK OF MAGNESIA) suspension 30 mL  30 mL Oral Daily PRN Derrill Center, NP      . risperiDONE (RISPERDAL) tablet 4 mg  4 mg Oral QHS Johnn Hai, MD   4 mg at 07/29/18 2129  . temazepam (RESTORIL) capsule 30 mg  30 mg Oral QHS Johnn Hai, MD   30 mg at 07/29/18 2129  . traZODone (DESYREL) tablet 50 mg  50 mg Oral QHS PRN Derrill Center, NP       PTA Medications: No medications prior to admission.    Patient Stressors:    Patient Strengths:    Treatment Modalities: Medication Management, Group therapy, Case management,  1 to 1 session with clinician, Psychoeducation, Recreational therapy.   Physician Treatment Plan for Primary Diagnosis: <principal problem not specified> Long Term Goal(s): Improvement in symptoms so as ready for discharge Improvement in symptoms so as ready for discharge    Short Term Goals: Ability to demonstrate self-control will improve Ability to identify and develop effective coping behaviors will improve Ability to maintain clinical measurements within normal limits will improve Ability to demonstrate self-control will improve Ability to identify and develop effective coping behaviors will improve Ability to maintain clinical measurements within normal limits will improve  Medication Management: Evaluate patient's response, side effects, and tolerance of medication regimen.  Therapeutic Interventions: 1 to 1 sessions, Unit Group sessions and Medication administration.  Evaluation of Outcomes: Progressing  Physician Treatment Plan for Secondary Diagnosis: Active Problems:   Bipolar depression ([place])  Long Term Goal(s): Improvement in symptoms so as ready for discharge Improvement in symptoms so as ready for discharge   Short Term Goals: Ability to demonstrate self-control will improve Ability to identify and develop effective coping behaviors will improve Ability to maintain clinical measurements within normal limits will improve Ability to demonstrate self-control will improve Ability to identify and develop effective coping behaviors will improve Ability to maintain clinical measurements within normal limits will improve     Medication Management: Evaluate patient's response, side effects, and tolerance of medication regimen.  Therapeutic Interventions: 1 to 1 sessions, Unit Group sessions and Medication administration.  Evaluation of Outcomes: Progressing   RN Treatment Plan for Primary Diagnosis: <principal problem not specified> Long Term  Goal(s): Knowledge of disease and therapeutic regimen to maintain health will improve  Short Term Goals: Ability to participate in decision making will improve, Ability to verbalize feelings will improve, Ability to disclose and discuss suicidal ideas, Ability to identify and develop effective coping  behaviors will improve and Compliance with prescribed medications will improve  Medication Management: RN will administer medications as ordered by provider, will assess and evaluate patient's response and provide education to patient for prescribed medication. RN will report any adverse and/or side effects to prescribing provider.  Therapeutic Interventions: 1 on 1 counseling sessions, Psychoeducation, Medication administration, Evaluate responses to treatment, Monitor vital signs and CBGs as ordered, Perform/monitor CIWA, COWS, AIMS and Fall Risk screenings as ordered, Perform wound care treatments as ordered.  Evaluation of Outcomes: Progressing   LCSW Treatment Plan for Primary Diagnosis: <principal problem not specified> Long Term Goal(s): Safe transition to appropriate next level of care at discharge, Engage patient in therapeutic group addressing interpersonal concerns.  Short Term Goals: Engage patient in aftercare planning with referrals and resources and Increase skills for wellness and recovery  Therapeutic Interventions: Assess for all discharge needs, 1 to 1 time with Social worker, Explore available resources and support systems, Assess for adequacy in community support network, Educate family and significant other(s) on suicide prevention, Complete Psychosocial Assessment, Interpersonal group therapy.  Evaluation of Outcomes: Progressing   Progress in Treatment: Attending groups: Yes. Participating in groups: Yes. Taking medication as prescribed: Yes. Toleration medication: Yes. Family/Significant other contact made: No, will contact:  will contact if given consent to contact Patient understands diagnosis: No. Discussing patient identified problems/goals with staff: Yes. Medical problems stabilized or resolved: Yes. Denies suicidal/homicidal ideation: Yes. Issues/concerns per patient self-inventory: No. Other:   New problem(s) identified: No, Describe:  None  New  Short Term/Long Term Goal(s): Medication stabilization, elimination of SI thoughts, and development of a comprehensive mental wellness plan.   Patient Goals:    Discharge Plan or Barriers: CSW will continue to follow up for appropriate referrals and possible discharge planning  Reason for Continuation of Hospitalization: Anxiety Medication stabilization  Estimated Length of Stay: 2-3 days  Attendees: Patient: 07/30/2018   Physician: Dr. Malvin JohnsBrian Farah, MD 07/30/2018   Nursing: Marton Redwoodoni, RN 07/30/2018   RN Care Manager: 07/30/2018   Social Worker: Stephannie PetersJasmine Jabier Deese, LCSW 07/30/2018   Recreational Therapist:  07/30/2018   Other:  07/30/2018   Other:  07/30/2018   Other: 07/30/2018      Scribe for Treatment Team: Delphia GratesJasmine M Rajanae Mantia, LCSW 07/30/2018 9:56 AM

## 2018-07-31 LAB — CARBAMAZEPINE LEVEL, TOTAL: Carbamazepine Lvl: 7.4 ug/mL (ref 4.0–12.0)

## 2018-07-31 MED ORDER — CARBAMAZEPINE 100 MG PO CHEW: 200.0000 mg | CHEWABLE_TABLET | Freq: Two times a day (BID) | ORAL | 1 refills | Status: DC

## 2018-07-31 MED ORDER — BENZTROPINE MESYLATE 0.5 MG PO TABS: 0.5000 mg | ORAL_TABLET | Freq: Two times a day (BID) | ORAL | 2 refills | Status: DC

## 2018-07-31 MED ORDER — TEMAZEPAM 30 MG PO CAPS: 30.0000 mg | ORAL_CAPSULE | Freq: Every evening | ORAL | 1 refills | Status: DC | PRN

## 2018-07-31 MED ORDER — RISPERIDONE 4 MG PO TABS: 4.0000 mg | ORAL_TABLET | Freq: Every day | ORAL | 1 refills | Status: DC

## 2018-07-31 NOTE — Plan of Care (Signed)
Patient was on the unit and given the materials for recreation therapy group for two sessions.

## 2018-07-31 NOTE — [facility] Suicide Risk Assessment (Signed)
[facility] INPATIENT:  Family/Significant Other Suicide Prevention Education  Suicide Prevention Education:  Education Completed;  mother, Joelene Millin [phone] has been identified by the patient as the family member/significant other with whom the patient will be residing, and identified as the person(s) who will aid the patient in the event of a mental health crisis (suicidal ideations/suicide attempt).  With written consent from the patient, the family member/significant other has been provided the following suicide prevention education, prior to the and/or following the discharge of the patient.  The suicide prevention education provided includes the following:  Suicide risk factors  Suicide prevention and interventions  National Suicide Hotline telephone number  [facility] assessment telephone number  [facility] Emergency Assistance [place] and/or Residential Mobile Crisis Unit telephone number  Request made of family/significant other to:  Remove weapons (e.g., guns, rifles, knives), all items previously/currently identified as safety concern.    Remove drugs/medications (over-the-counter, prescriptions, illicit drugs), all items previously/currently identified as a safety concern.  The family member/significant other verbalizes understanding of the suicide prevention education information provided.  The family member/significant other agrees to remove the items of safety concern listed above.  Joellen Jersey 07/31/2018, 9:06 AM

## 2018-07-31 NOTE — Discharge Summary (Signed)
Physician Discharge Summary Note  Patient:  Laurie Hooper is an 28 y.o., female MRN:  [id number] DOB:  Apr 25, 1990 Patient phone:  [phone] (home)  Patient address:   [place] [address],  Total Time spent with patient: 45 minutes  Date of Admission:  07/28/2018 Date of Discharge: 07/31/2018  Reason for Admission:   Laurie Hooper is 35 she is single she is admitted here for the first time and she reports this to be her first psychiatric admission here or elsewhere.  She is admitted for mood instability, some degree of psychosis, bizarre behaviors, recently quitting her job and again a cluster behaviors indicating a recurrence of a bipolar type condition.  Apparently she was diagnosed with a bipolar condition while in college, but receiving no treatment currently.  She denied substance abuse she stated that she used "weed" on an intake form but states her drug screen is positive for "CBD from a couple of weeks ago" at any rate she is clearly suffering from a probable exacerbation in this bipolar condition and tells me a few bizarre things such as "my family just thinks I am doing witchcraft" and last night everybody was challenging God"  She is more organized than previously described, according to the assessment team notes which are extensive  Laurie C Wilkersonis an 28 y.o.singlefemalewho presents to [facility] accompanied by maternal grandparents, Laurie and Laurie Hooper, who are concerned due to changes in Pt's mood and behavior. Pt was unable to properly complete the intake form. Pt appears psychotic and responds to most questions with "I love my family" or "no". Pt cannot say why she is at [facility] other than she loves her family. Pt reports her mood is "jovial." When asked how she is sleeping, Pt states "like a baby." Pt responds "no" to all depressive symptoms, suicidal ideation, homicidal ideation, auditory or visual hallucination but appears to be saying  "no" to any questions asked of her. Pt says "no" to using marijuana but indicated she used "weed" on her intake form. Per Pt's medical record, Pt has tested positive for marijuana in the past when presenting with hallucinations. To open ended questions, she doesn't respond or says she loves her family.  Pt's grandparents says Pt quit working at a job three weeks ago. They reports that Pt had manic symptoms and insomnia in college and was diagnosed with bipolar disorder. They noticed approximately two weeks ago that she seemed somewhat accusatory to people. They says four days ago she became paranoid and accusing family members of spying on her and questioning who she could trust. Grandparents report she was accusing her four-year-old daughter of attempting to "brainwash" Pt's mother. Today Pt was staying her her great-grandmother and was outside in the yard, flailing her arms and yelling in distress. She also was standing over her great-grandmother, agitated and accusing her great-grandmother of controlling her.  Pt lives with her grandparents and her four-year-old child. She doesn't appear to taking any prescription medications. She has a boyfriend but grandparents says this boyfriend doesn't appear supportive of Pt. Pt has no mental health providers and no history of inpatient psychiatric treatment.  Pt iscasually dressed. Shealert and oriented person, place and date.She is able to correctly name the current president.Pt speaks in a clear tone, at moderate volume and normal pace. Motor behavior appears normal. Eye contact is at times intense. Pt's mood ispleasantand affect is anxious and somewhat constricted. Thought process is circumstantial and poorly organized.Pt appears at times  to be responding to internal stimuli. After the assessment, she was observed to be chanting.   Principal Problem: <principal problem not specified> Discharge Diagnoses: Active Problems:   Bipolar depression  (HCC)   Past Psychiatric History: 1 prior episode  Past Medical History:  Past Medical History:  Diagnosis Date  . Medical history non-contributory     Past Surgical History:  Procedure Laterality Date  . KNEE ARTHROSCOPY W/ ACL RECONSTRUCTION     Family History:  Family History  Problem Relation Age of Onset  . Diabetes Paternal Grandmother   . Kidney disease Father   . Thyroid disease Mother   . Cancer Neg Hx    Family Psychiatric  History: Unknown to patient Social History:  Social History   Substance and Sexual Activity  Alcohol Use Not Currently     Social History   Substance and Sexual Activity  Drug Use No    Social History   Socioeconomic History  . Marital status: Single    Spouse name: Not on file  . Number of children: Not on file  . Years of education: Not on file  . Highest education level: Not on file  Occupational History  . Not on file  Social Needs  . Financial resource strain: Not on file  . Food insecurity    Worry: Not on file    Inability: Not on file  . Transportation needs    Medical: Not on file    Non-medical: Not on file  Tobacco Use  . Smoking status: Former Smoker    Types: Cigarettes  . Smokeless tobacco: Never Used  Substance and Sexual Activity  . Alcohol use: Not Currently  . Drug use: No  . Sexual activity: Yes    Birth control/protection: None  Lifestyle  . Physical activity    Days per week: 3 days    Minutes per session: 30 min  . Stress: Not on file  Relationships  . Social Musicianconnections    Talks on phone: Not on file    Gets together: Not on file    Attends religious service: Not on file    Active member of club or organization: Not on file    Attends meetings of clubs or organizations: Not on file    Relationship status: Not on file  Other Topics Concern  . Not on file  Social History Narrative  . Not on file    Hospital Course:    As discussed patient was admitted with a recurrence of a bipolar  type condition and treated with a combination of Tegretol for mood stabilization and Risperdal and she tolerated these medications well she always lacked a general knowledge of why she was here and really had no recall of the disruptive and problematic behaviors and issues but at any rate she did respond very well to treatment.  By the date of the 23rd she was alert oriented cooperative no suicidal thoughts no mania no racing thoughts stable, no EPS or TD and I reduced her Risperdal to 4 mg at bedtime only.  She was very tearful the day before but it was simply related to being here she felt that when she was discharged her anxiety and depression would resolve.  But at the point of discharge no acute mania or psychosis  Musculoskeletal: Strength & Muscle Tone: within normal limits Gait & Station: normal Patient leans: N/A  Psychiatric Specialty Exam: ROS  Blood pressure (!) 101/58, pulse (!) 124, temperature 98 F (36.7 C), temperature  source Oral, resp. rate 18, height 6\' 2"  (1.88 m), weight 98.4 kg, SpO2 100 %.Body mass index is 27.86 kg/m.  General Appearance: Casual  Eye Contact::  Good  Speech:  Clear and Coherent409  Volume:  Normal  Mood:  Euthymic  Affect:  Congruent and Full Range  Thought Process:  Coherent and Descriptions of Associations: Intact  Orientation:  Full (Time, Place, and Person)  Thought Content:  Logical and Tangential  Suicidal Thoughts:  No  Homicidal Thoughts:  No  Memory:  Immediate;   Fair  Judgement:  Intact  Insight:  Fair  Psychomotor Activity:  Normal  Concentration:  Good  Recall:  Good  Fund of Knowledge:Good  Language: Good  Akathisia:  Negative  Handed:  Right  AIMS (if indicated):     Assets:  Communication Skills Desire for Improvement  Sleep:  Number of Hours: 6.75  Cognition: WNL  ADL's:  Intact         Has this patient used any form of tobacco in the last 30 days? (Cigarettes, Smokeless Tobacco, Cigars, and/or Pipes) Yes,  No  Blood Alcohol level:  Lab Results  Component Value Date   ETH <10 07/28/2018   ETH <10 03/13/2018    Metabolic Disorder Labs:  Lab Results  Component Value Date   HGBA1C 5.3 07/29/2018   MPG 105.41 07/29/2018   Lab Results  Component Value Date   PROLACTIN 107.9 (H) 07/29/2018   Lab Results  Component Value Date   CHOL 171 07/29/2018   TRIG 87 07/29/2018   HDL 31 (L) 07/29/2018   CHOLHDL 5.5 07/29/2018   VLDL 17 07/29/2018   LDLCALC 123 (H) 07/29/2018    See Psychiatric Specialty Exam and Suicide Risk Assessment completed by Attending Physician prior to discharge.  Discharge destination:  Home  Is patient on multiple antipsychotic therapies at discharge:  No   Has Patient had three or more failed trials of antipsychotic monotherapy by history:  No  Recommended Plan for Multiple Antipsychotic Therapies: NA   Allergies as of 07/31/2018   No Known Allergies     Medication List    TAKE these medications     Indication  benztropine 0.5 MG tablet Commonly known as: COGENTIN Take 1 tablet (0.5 mg total) by mouth 2 (two) times daily.  Indication: Extrapyramidal Reaction caused by Medications   carbamazepine 100 MG chewable tablet Commonly known as: TEGRETOL Chew 2 tablets (200 mg total) by mouth 2 (two) times a day.  Indication: Manic-Depression   risperidone 4 MG tablet Commonly known as: RISPERDAL Take 1 tablet (4 mg total) by mouth at bedtime.  Indication: Manic Phase of Manic-Depression   temazepam 30 MG capsule Commonly known as: RESTORIL Take 1 capsule (30 mg total) by mouth at bedtime as needed for sleep.  Indication: Trouble Sleeping      Follow-up Information    [facility] Follow up on 08/06/2018.   Why: Telephonic hospital follow up appointment is Wednesday, 7/29 at 9:00a.  The provider will contact you.  Contact information: [address] [place] [address] [phone]           SignedMalvin Johns: Telvin Reinders, MD 07/31/2018, 9:05  AM

## 2018-07-31 NOTE — BHH Suicide Risk Assessment (Signed)
[facility] Discharge Suicide Risk Assessment   Principal Problem: Recurrence of bipolar disorder Discharge Diagnoses: Active Problems:   Bipolar depression ([place])   Total Time spent with patient: 45 minutes  Musculoskeletal: Strength & Muscle Tone: within normal limits Gait & Station: normal Patient leans: N/A  Psychiatric Specialty Exam: ROS  Blood pressure (!) 101/58, pulse (!) 124, temperature 98 F (36.7 C), temperature source Oral, resp. rate 18, height 6\' 2"  (1.88 m), weight 98.4 kg, SpO2 100 %.Body mass index is 27.86 kg/m.  General Appearance: Casual  Eye Contact::  Good  Speech:  Clear and Coherent409  Volume:  Normal  Mood:  Euthymic  Affect:  Congruent and Full Range  Thought Process:  Coherent and Descriptions of Associations: Intact  Orientation:  Full (Time, Place, and Person)  Thought Content:  Logical and Tangential  Suicidal Thoughts:  No  Homicidal Thoughts:  No  Memory:  Immediate;   Fair  Judgement:  Intact  Insight:  Fair  Psychomotor Activity:  Normal  Concentration:  Good  Recall:  Good  Fund of Knowledge:Good  Language: Good  Akathisia:  Negative  Handed:  Right  AIMS (if indicated):     Assets:  Communication Skills Desire for Improvement  Sleep:  Number of Hours: 6.75  Cognition: WNL  ADL's:  Intact   Mental Status Per Nursing Assessment::   On Admission:  NA  Demographic Factors:  NA  Loss Factors: NA  Historical Factors: 1 prior episode thought to be bipolar  Risk Reduction Factors:   Sense of responsibility to family and Religious beliefs about death  Continued Clinical Symptoms: Currently euthymic Cognitive Features That Contribute To Risk:  None    Suicide Risk:  Minimal: No identifiable suicidal ideation.  Patients presenting with no risk factors but with morbid ruminations; may be classified as minimal risk based on the severity of the depressive symptoms  Follow-up Information    [facility] Follow up on 08/06/2018.   Why:  Telephonic hospital follow up appointment is Wednesday, 7/29 at 9:00a.  The provider will contact you.  Contact information: [address] [place] [address] [phone]           Plan Of Care/Follow-up recommendations:  Activity:  full  Ladonte Verstraete, MD 07/31/2018, 8:23 AM

## 2018-07-31 NOTE — Progress Notes (Signed)
  [facility] Adult Case Management Discharge Plan :  Will you be returning to the same living situation after discharge:  Yes,  home At discharge, do you have transportation home?: Yes,  mother will pick up at 11:00am Do you have the ability to pay for your medications: No. Provided samples and referred to [facility].  Release of information consent forms completed and in the chart. Patient to Follow up at: Follow-up Information    [facility] Follow up on 08/06/2018.   Why: Telephonic hospital follow up appointment is Wednesday, 7/29 at 9:00a.  The provider will contact you.  Contact information: [address] [place] [address] [phone]           Next level of care provider has access to [place] and Suicide Prevention discussed: Yes,  with mother     Has patient been referred to the Quitline?: N/A patient is not a smoker  Patient has been referred for addiction treatment: Yes  Joellen Jersey, [place] 07/31/2018, 10:32 AM

## 2018-07-31 NOTE — Progress Notes (Signed)
Recreation Therapy Notes  INPATIENT RECREATION TR PLAN  Patient Details Name: Laurie Hooper MRN: [id number] DOB: May 19, 1990 Today's Date: 07/31/2018  Rec Therapy Plan Is patient appropriate for Therapeutic Recreation?: Yes Treatment times per week: 3-5 times per week Estimated Length of Stay: 5-7 days TR Treatment/Interventions: Group participation (Comment)  Discharge Criteria Pt will be discharged from therapy if:: Discharged Treatment plan/goals/alternatives discussed and agreed upon by:: Patient/family  Discharge Summary Short term goals set: see patient care plan Short term goals met: Adequate for discharge Progress toward goals comments: Groups attended Which groups?: Coping skills, Other (Comment)(introduction into anxiety) Reason goals not met: n/a Therapeutic equipment acquired: none Reason patient discharged from therapy: Discharge from hospital Pt/family agrees with progress & goals achieved: Yes Date patient discharged from therapy: 07/31/18  Tomi Likens, LRT/CTRS  [place] 07/31/2018, 1:32 PM

## 2018-07-31 NOTE — Progress Notes (Signed)
Pt discharged to lobby. Pt was stable and appreciative at that time. All papers, samples and prescriptions were given and valuables returned. Verbal understanding expressed. Denies SI/HI and A/VH. Pt given opportunity to express concerns and ask questions.  

## 2018-08-16 ENCOUNTER — Other Ambulatory Visit: Payer: Self-pay

## 2018-08-16 ENCOUNTER — Emergency Department (HOSPITAL_COMMUNITY)
Admission: EM | Admit: 2018-08-16 | Discharge: 2018-08-16 | Disposition: A | Discharge status: Home / Self Care | Payer: Medicaid Other | Attending: Emergency Medicine | Admitting: Emergency Medicine

## 2018-08-16 ENCOUNTER — Encounter (HOSPITAL_COMMUNITY): Payer: Self-pay

## 2018-08-16 DIAGNOSIS — Z008 Encounter for other general examination: Secondary | ICD-10-CM | POA: Diagnosis present

## 2018-08-16 DIAGNOSIS — Z5321 Procedure and treatment not carried out due to patient leaving prior to being seen by health care provider: Secondary | ICD-10-CM | POA: Insufficient documentation

## 2018-08-16 HISTORY — DX: Anxiety disorder, unspecified: F41.9

## 2018-08-16 HISTORY — DX: Unspecified psychosis not due to a substance or known physiological condition: F29

## 2018-08-16 NOTE — ED Triage Notes (Signed)
Pt requesting for a urine test to see what is going on with her body. Denies any pain . Also asking for pregancy test

## 2018-10-01 ENCOUNTER — Ambulatory Visit: Payer: Self-pay | Admitting: Family Medicine

## 2018-10-01 DIAGNOSIS — Z0289 Encounter for other administrative examinations: Secondary | ICD-10-CM

## 2018-11-03 ENCOUNTER — Ambulatory Visit: Payer: Self-pay | Admitting: Family Medicine

## 2018-11-06 ENCOUNTER — Emergency Department (HOSPITAL_COMMUNITY)
Admission: EM | Admit: 2018-11-06 | Discharge: 2018-11-06 | Disposition: A | Discharge status: Home / Self Care | Payer: Self-pay | Attending: Emergency Medicine | Admitting: Emergency Medicine

## 2018-11-06 ENCOUNTER — Other Ambulatory Visit: Payer: Self-pay

## 2018-11-06 ENCOUNTER — Encounter (HOSPITAL_COMMUNITY): Payer: Self-pay

## 2018-11-06 DIAGNOSIS — Z87891 Personal history of nicotine dependence: Secondary | ICD-10-CM | POA: Insufficient documentation

## 2018-11-06 DIAGNOSIS — R52 Pain, unspecified: Secondary | ICD-10-CM | POA: Insufficient documentation

## 2018-11-06 DIAGNOSIS — F319 Bipolar disorder, unspecified: Secondary | ICD-10-CM | POA: Insufficient documentation

## 2018-11-06 DIAGNOSIS — Z79899 Other long term (current) drug therapy: Secondary | ICD-10-CM | POA: Insufficient documentation

## 2018-11-06 DIAGNOSIS — R443 Hallucinations, unspecified: Secondary | ICD-10-CM

## 2018-11-06 DIAGNOSIS — R440 Auditory hallucinations: Secondary | ICD-10-CM | POA: Insufficient documentation

## 2018-11-06 DIAGNOSIS — F5105 Insomnia due to other mental disorder: Secondary | ICD-10-CM | POA: Insufficient documentation

## 2018-11-06 LAB — COMPREHENSIVE METABOLIC PANEL
ALT: 15 U/L (ref 0–44)
AST: 14 U/L — ABNORMAL LOW (ref 15–41)
Albumin: 4 g/dL (ref 3.5–5.0)
Alkaline Phosphatase: 48 U/L (ref 38–126)
Anion gap: 12 (ref 5–15)
BUN: 7 mg/dL (ref 6–20)
CO2: 19 mmol/L — ABNORMAL LOW (ref 22–32)
Calcium: 8.7 mg/dL — ABNORMAL LOW (ref 8.9–10.3)
Chloride: 106 mmol/L (ref 98–111)
Creatinine, Ser: 0.92 mg/dL (ref 0.44–1.00)
GFR calc Af Amer: >60 mL/min (ref >60)
GFR calc non Af Amer: >60 mL/min (ref >60)
Glucose, Bld: 99 mg/dL (ref 70–99)
Potassium: 3.3 mmol/L — ABNORMAL LOW (ref 3.5–5.1)
Sodium: 137 mmol/L (ref 135–145)
Total Bilirubin: 1.3 mg/dL — ABNORMAL HIGH (ref 0.3–1.2)
Total Protein: 6.4 g/dL — ABNORMAL LOW (ref 6.5–8.1)

## 2018-11-06 LAB — ACETAMINOPHEN LEVEL: Acetaminophen (Tylenol), Serum: <10 ug/mL — ABNORMAL LOW (ref 10–30)

## 2018-11-06 LAB — CBC
HCT: 40 % (ref 36.0–46.0)
Hemoglobin: 14 g/dL (ref 12.0–15.0)
MCH: 30.4 pg (ref 26.0–34.0)
MCHC: 35 g/dL (ref 30.0–36.0)
MCV: 86.8 fL (ref 80.0–100.0)
Platelets: 292 10*3/uL (ref 150–400)
RBC: 4.61 MIL/uL (ref 3.87–5.11)
RDW: 12 % (ref 11.5–15.5)
WBC: 11.7 10*3/uL — ABNORMAL HIGH (ref 4.0–10.5)
nRBC: 0 % (ref 0.0–0.2)

## 2018-11-06 LAB — I-STAT BETA HCG BLOOD, ED (MC, WL, AP ONLY): I-stat hCG, quantitative: <5 m[IU]/mL (ref <5)

## 2018-11-06 LAB — ETHANOL: Alcohol, Ethyl (B): <10 mg/dL (ref <10)

## 2018-11-06 LAB — SALICYLATE LEVEL: Salicylate Lvl: <7 mg/dL (ref 2.8–30.0)

## 2018-11-06 MED ORDER — RISPERIDONE 3 MG PO TABS
4.0000 mg | ORAL_TABLET | Freq: Every day | ORAL | Status: DC
  Administered 2018-11-06: 4 mg via ORAL
  Filled 2018-11-06: qty 1

## 2018-11-06 MED ORDER — TEMAZEPAM 15 MG PO CAPS: 30.0000 mg | ORAL_CAPSULE | Freq: Every evening | ORAL | Status: DC | PRN

## 2018-11-06 MED ORDER — BENZTROPINE MESYLATE 1 MG PO TABS
0.5000 mg | ORAL_TABLET | Freq: Two times a day (BID) | ORAL | Status: DC
  Administered 2018-11-06: 23:00:00 0.5 mg via ORAL
  Filled 2018-11-06: qty 1

## 2018-11-06 MED ORDER — CARBAMAZEPINE 100 MG PO CHEW
200.0000 mg | CHEWABLE_TABLET | Freq: Two times a day (BID) | ORAL | Status: DC
  Administered 2018-11-06: 200 mg via ORAL
  Filled 2018-11-06: qty 2

## 2018-11-06 NOTE — Discharge Instructions (Signed)
Please make sure to take ALL of your home medications including your psychiatric medications. Follow-up at [facility].  List of other resources attached for you if needed. Return here for any new/acute changes.

## 2018-11-06 NOTE — ED Provider Notes (Signed)
  10:55 PM TTS has evaluated-- does not meet IP criteria, no SI/HI.  Mom was present on the call and comfortable with care plan to discharge.  Patient never had any follow-up after last admission and did not take her medications-- mother and grandparents (whom she lives with) will help make sure she stays on track.  She has follow-up with [facility].  May return here for any new/acute changes.   Larene Pickett, PA-C 11/06/18 [place], Ankit, MD 11/06/18 2303

## 2018-11-06 NOTE — ED Triage Notes (Signed)
Pt presents POV with "increase stress with work and life" pt states especially with "covid"

## 2018-11-06 NOTE — ED Notes (Addendum)
Asked pt if she had to go to restroom pt stated "not right now".

## 2018-11-06 NOTE — ED Notes (Signed)
Pt very anxious and restless, mumbling to herself. Pt states "I'm trying to collect my thoughts" pt denies HI/SI

## 2018-11-06 NOTE — ED Provider Notes (Signed)
[facility] EMERGENCY DEPARTMENT Provider Note   CSN: [id number] Arrival date & time: 11/06/18  2033     History   Chief Complaint Chief Complaint  Patient presents with  . Medical Clearance    HPI Laurie Hooper is a 28 y.o. female.     HPI   Patient is a 28 year old female with a history of anxiety, psychosis, who presents to the emergency department today for evaluation of hallucinations.  Patient states that lately she has been feeling very anxious and has not been able to sleep due to "thoughts that are not mine ".  States she is hearing voices.  I asked her several times what the voices are saying and she repeatedly states "it is just a lot "and does not elaborate on what the voices are saying.  She does state that "there are several crevices in my house "and that sometimes when the wind blows to the crevices "it can be interpreted as voices ".  She denies any overt SI, HI or visual hallucinations.  She denies any EtOH use.  She uses CBD oil but denies any other illicit substance use.  She complains of allover body pain and states that she is "hormonal "but denies any other significant medical complaints.  Past Medical History:  Diagnosis Date  . Anxiety   . Medical history non-contributory   . Psychosis [facility])     Patient Active Problem List   Diagnosis Date Noted  . Bipolar depression ([place]) 07/28/2018  . Encounter for initial prescription of contraceptive pills 07/10/2017  . Dysmenorrhea, unspecified 07/10/2017  . Labor and delivery indication for care or intervention 06/13/2014  . Subchorionic hematoma in second trimester 01/14/2014  . Placental abnormality in first trimester 01/14/2014  . Chlamydia infection affecting pregnancy in first trimester, antepartum 11/25/2013  . Supervision of normal pregnancy 11/04/2013    Past Surgical History:  Procedure Laterality Date  . KNEE ARTHROSCOPY W/ ACL RECONSTRUCTION       OB History    Gravida  1   Para  1   Term  1   Preterm      AB      Living  1     SAB      TAB      Ectopic      Multiple  0   Live Births  1            Home Medications    Prior to Admission medications   Medication Sig Start Date End Date Taking? Authorizing Provider  benztropine (COGENTIN) 0.5 MG tablet Take 1 tablet (0.5 mg total) by mouth 2 (two) times daily. 07/31/18   Johnn Hai, MD  carbamazepine (TEGRETOL) 100 MG chewable tablet Chew 2 tablets (200 mg total) by mouth 2 (two) times a day. 07/31/18   Johnn Hai, MD  risperiDONE (RISPERDAL) 4 MG tablet Take 1 tablet (4 mg total) by mouth at bedtime. 07/31/18   Johnn Hai, MD  temazepam (RESTORIL) 30 MG capsule Take 1 capsule (30 mg total) by mouth at bedtime as needed for sleep. 07/31/18   Johnn Hai, MD    Family History Family History  Problem Relation Age of Onset  . Diabetes Paternal Grandmother   . Kidney disease Father   . Thyroid disease Mother   . Cancer Neg Hx     Social History Social History   Tobacco Use  . Smoking status: Former Smoker    Types: Cigarettes  . Smokeless tobacco: Never  Used  Substance Use Topics  . Alcohol use: Not Currently  . Drug use: No     Allergies   Patient has no known allergies.   Review of Systems Review of Systems  Constitutional: Negative for chills and fever.  HENT: Negative for ear pain and sore throat.   Eyes: Negative for visual disturbance.  Respiratory: Negative for cough and shortness of breath.   Cardiovascular: Negative for chest pain.  Gastrointestinal: Negative for abdominal pain, constipation, diarrhea, nausea and vomiting.  Genitourinary: Negative for dysuria and hematuria.  Musculoskeletal: Negative for back pain.  Skin: Negative for rash.  Neurological: Negative for headaches.  Psychiatric/Behavioral: Positive for hallucinations.  All other systems reviewed and are negative.    Physical Exam Updated Vital Signs BP 131/82 (BP  Location: Right Arm)   Pulse 92   Temp 97.9 F (36.6 C) (Oral)   Resp 18   Ht  (1.88 m)   Wt 83.9 kg   SpO2 98%   BMI 23.75 kg/m   Physical Exam Vitals signs and nursing note reviewed.  Constitutional:      General: She is not in acute distress.    Appearance: She is well-developed.  HENT:     Head: Normocephalic and atraumatic.  Eyes:     Conjunctiva/sclera: Conjunctivae normal.  Neck:     Musculoskeletal: Neck supple.  Cardiovascular:     Rate and Rhythm: Normal rate and regular rhythm.     Heart sounds: No murmur.  Pulmonary:     Effort: Pulmonary effort is normal. No respiratory distress.     Breath sounds: Normal breath sounds.  Abdominal:     General: Bowel sounds are normal.     Palpations: Abdomen is soft.     Tenderness: There is no abdominal tenderness.  Skin:    General: Skin is warm and dry.  Neurological:     Mental Status: She is alert.  Psychiatric:        Mood and Affect: Affect is flat.        Speech: Speech is delayed.        Behavior: Behavior is withdrawn.        Thought Content: Thought content does not include homicidal or suicidal ideation. Thought content does not include homicidal or suicidal plan.     ED Treatments / Results  Labs (all labs ordered are listed, but only abnormal results are displayed) Labs Reviewed  COMPREHENSIVE METABOLIC PANEL - Abnormal; Notable for the following components:      Result Value   Potassium 3.3 (*)    CO2 19 (*)    Calcium 8.7 (*)    Total Protein 6.4 (*)    AST 14 (*)    Total Bilirubin 1.3 (*)    All other components within normal limits  ACETAMINOPHEN LEVEL - Abnormal; Notable for the following components:   Acetaminophen (Tylenol), Serum <10 (*)    All other components within normal limits  CBC - Abnormal; Notable for the following components:   WBC 11.7 (*)    All other components within normal limits  ETHANOL  SALICYLATE LEVEL  RAPID URINE DRUG SCREEN, HOSP PERFORMED  I-STAT BETA  HCG BLOOD, ED ([facility], [facility], [facility] ONLY)    EKG None  Radiology No results found.  Procedures Procedures (including critical care time)  Medications Ordered in ED Medications  benztropine (COGENTIN) tablet 0.5 mg (has no administration in time range)  carbamazepine (TEGRETOL) chewable tablet 200 mg (has no administration in time range)  risperiDONE (RISPERDAL) tablet 4 mg (has no administration in time range)  temazepam (RESTORIL) capsule 30 mg (has no administration in time range)     Initial Impression / Assessment and Plan / ED Course  I have reviewed the triage vital signs and the nursing notes.  Pertinent labs & imaging results that were available during my care of the patient were reviewed by me and considered in my medical decision making (see chart for details).   Final Clinical Impressions(s) / ED Diagnoses   Final diagnoses:  Hallucination   28 year old female with history of psychosis presenting for evaluation of auditory hallucinations and anxiety.  States she just wants something to sleep because she has not been able sleep for the last few days because of the voices.  Denies SI, HI.  Exam is benign.  Vitals normal. CBC with mild leukocytosis CMP nonacute EtOH, salicylate and acetaminophen levels negative  Pt without emergent medical problem at this time that would require further work-up or admission to the hospital.  She is appropriate for TTS evaluation.   ED Discharge Orders    None       Rayne Du 11/06/18 2220    Derwood Kaplan, MD 11/06/18 2229

## 2018-11-06 NOTE — ED Notes (Signed)
Pt verbalized understanding of d/c instructions and follow up care. Pt had no futher questions at this time 

## 2018-11-06 NOTE — BH Assessment (Signed)
Tele Assessment Note   Patient Name: Laurie Hooper MRN: [id number] Referring Physician: Leonia Coronaortni Couture, PA Location of Patient: [facility] Location of Provider: Behavioral Health TTS Department  Laurie Hooper is an 28 y.o. female.  -Clinician reviewed note by Leonia Coronaortni Couture, PA.  Pt is a 28 year old female with history of psychosis presenting for evaluation of auditory hallucinations and anxiety.  States she just wants something to sleep because she has not been able sleep for the last few days because of the voices.  Denies SI, HI.  Patient appears to be very tired.  Her eye contact is good.  She does answer slowly to questions.  At this time she says that COVID has been worrying her and causing some anxiety.  Patient says she has not been sleeping well since Sunday (10/25).  Patient says that she has been restless and worried.  Patient's mother is present during assessment, with permission of patient.  Patient lives with maternal grandparents.  She says she is working but has missed a little work.  Patient says she has SI "once in awhile" but denies any current plan or intention to harm or kill self.  Patient has no previous attempts.  Patient denies any HI or visual hallucinations.  Patient says initially that she is not hearing voices.  Mother and clinician asked for clarification.  Patient says she may hear someone call her name or voices off and on.  She also blames it on "wind blowing through crevices in the house."  Patient does not appear to currently be responding to internal stimuli.  Patient denies any use of ETOH or other illicit drugs.    Patient has a flat affect.  Her responses to questions are slow at times and she complains of not getting enough sleep.  She admits that when she was at [facility] from July 20-23, 2020 she [facility] not [facility] or take her medications as directed.  Patient answers are logical but at times may be "off" due to her exhaustion.  Patient yawns  during assessment.  She is oriented x3.  Patient [facility]  Her mother has no concerns about safety with patient going back home.  She said that grandmother has no concerns either.  Mother said that she and grandmother will endorse taking meds as directed and getting set up with therapy and med monitoring [facility].    -Clinician discussed patient care with Lerry Linerashaun Dixon, NP and Renaye RakersAdaku Anike, NP.  The did not recommend inpatient care at this time.  They recommended patient follow up with [facility].  Clinician informed PA Sharilyn SitesLisa Sanders of disposition.  She said they will discharge patient home.  Diagnosis: F31.2 Bipolar 1 d/o most recent episode manic w/ psychotic features  Past Medical History:  Past Medical History:  Diagnosis Date  . Anxiety   . Medical history non-contributory   . Psychosis [facility](HCC)     Past Surgical History:  Procedure Laterality Date  . KNEE ARTHROSCOPY W/ ACL RECONSTRUCTION      Family History:  Family History  Problem Relation Age of Onset  . Diabetes Paternal Grandmother   . Kidney disease Father   . Thyroid disease Mother   . Cancer Neg Hx     Social History:  reports that she has quit smoking. Her smoking use included cigarettes. She has never used smokeless tobacco. She reports previous alcohol use. She reports that she does not use drugs.  Additional Social History:  Alcohol / Drug Use Pain Medications: None Prescriptions: Temazepam, Reperidone, Benztropine, Carbamazepine.  Patient states that she is not taking meds as directed.  Same meds as when she was d/c'ed in July from [facility]. Over the Counter: Benedryl & Melatonin, CBD oil History of alcohol / drug use?: No history of alcohol / drug abuse  CIWA: CIWA-Ar BP: 131/82 Pulse Rate: 92 COWS:    Allergies: No Known Allergies  Home Medications: (Not in a hospital admission)   OB/GYN Status:  No LMP recorded.  General Assessment Data Location of Assessment:  [facility] ED TTS Assessment: In system Is this a Tele or Face-to-Face Assessment?: Tele Assessment Is this an Initial Assessment or a Re-assessment for this encounter?: Initial Assessment Patient Accompanied by:: Parent Language Other than English: No Living Arrangements: Other (Comment)(Pt lives with grandmother.) What gender do you identify as?: Female Marital status: Single Pregnancy Status: No Living Arrangements: Other relatives Can pt return to current living arrangement?: Yes Admission Status: Voluntary Is patient capable of signing voluntary admission?: Yes Referral Source: Self/Family/Friend(Mother brought her to hospital.) Insurance type: self pay     Crisis Care Plan Living Arrangements: Other relatives Name of Psychiatrist: None Name of Therapist: None  Education Status Is patient currently in school?: No Is the patient employed, unemployed or receiving disability?: Employed  Risk to self with the past 6 months Suicidal Ideation: No Has patient been a risk to self within the past 6 months prior to admission? : No Suicidal Intent: No Has patient had any suicidal intent within the past 6 months prior to admission? : No Is patient at risk for suicide?: No Suicidal Plan?: No Has patient had any suicidal plan within the past 6 months prior to admission? : No Access to Means: No What has been your use of drugs/alcohol within the last 12 months?: Denies Previous Attempts/Gestures: No How many times?: 0 Other Self Harm Risks: None Triggers for Past Attempts: None known Intentional Self Injurious Behavior: None Family Suicide History: No Recent stressful life event(s): Other (Comment)(Pt says working and COVID are stressors) Persecutory voices/beliefs?: No Depression: Yes Depression Symptoms: Despondent, Insomnia, Loss of interest in usual pleasures, Feeling worthless/self pity, Isolating Substance abuse history and/or treatment for substance abuse?: No Suicide prevention  information given to non-admitted patients: Not applicable  Risk to Others within the past 6 months Homicidal Ideation: No Does patient have any lifetime risk of violence toward others beyond the six months prior to admission? : No Thoughts of Harm to Others: No Current Homicidal Intent: No Current Homicidal Plan: No Access to Homicidal Means: No Identified Victim: No one History of harm to others?: No Assessment of Violence: None Noted Violent Behavior Description: None reported Does patient have access to weapons?: No Criminal Charges Pending?: No Does patient have a court date: No Is patient on probation?: No  Psychosis Hallucinations: Auditory(May hear someone call her name.) Delusions: None noted  Mental Status Report Appearance/Hygiene: Unremarkable, In hospital gown Eye Contact: Good Motor Activity: Freedom of movement, Unremarkable Speech: Logical/coherent Level of Consciousness: Quiet/awake Mood: Depressed Affect: Blunted Anxiety Level: Moderate Thought Processes: Coherent, Relevant Judgement: Unimpaired Orientation: Person, Place, Situation Obsessive Compulsive Thoughts/Behaviors: None  Cognitive Functioning Concentration: Poor Memory: Remote Intact, Recent Impaired Is patient IDD: No Insight: Poor Impulse Control: Fair Appetite: Fair Have you had any weight changes? : No Change Sleep: Decreased Total Hours of Sleep: (<4H/D since 10/25.  ) Vegetative Symptoms: None  ADLScreening [facility] Assessment Services) Patient's cognitive ability adequate to safely complete daily activities?:  Yes Patient able to express need for assistance with ADLs?: Yes Independently performs ADLs?: Yes (appropriate for developmental age)  Prior Inpatient Therapy Prior Inpatient Therapy: Yes Prior Therapy Dates: 07/28/18 Prior Therapy Facilty/Provider(s): [facility] Reason for Treatment: anxiety  Prior Outpatient Therapy Prior Outpatient Therapy: No Does patient have an ACCT team?:  No Does patient have Intensive In-House Services?  : No Does patient have [facility] services? : No Does patient have P4[facility] services?: No  ADL Screening (condition at time of admission) Patient's cognitive ability adequate to safely complete daily activities?: Yes Is the patient deaf or have difficulty hearing?: No Does the patient have difficulty seeing, even when wearing glasses/contacts?: No Does the patient have difficulty concentrating, remembering, or making decisions?: Yes Patient able to express need for assistance with ADLs?: Yes Does the patient have difficulty dressing or bathing?: No Independently performs ADLs?: Yes (appropriate for developmental age) Does the patient have difficulty walking or climbing stairs?: No Weakness of Legs: None Weakness of Arms/Hands: None       Abuse/Neglect Assessment (Assessment to be complete while patient is alone) Abuse/Neglect Assessment Can Be Completed: Yes Physical Abuse: Denies Verbal Abuse: Denies Sexual Abuse: Denies Exploitation of patient/patient's resources: Denies Self-Neglect: Denies     Regulatory affairs officer (For Healthcare) Does Patient Have a Medical Advance Directive?: No Would patient like information on creating a medical advance directive?: No - Patient declined          Disposition:  Disposition Initial Assessment Completed for this Encounter: Yes Patient referred to: [facility](Follow up w/ Beverly Sessions)  This service was provided via telemedicine using a 2-way, interactive audio and Radiographer, therapeutic.  Names of all persons participating in this telemedicine service and their role in this encounter. Name: Laurie Hooper Role: patient  Name: Percival Spanish Role: mother  Name: Curlene Dolphin, M.S. LCAS QP Role: clinician  Name:  Role:     Raymondo Band 11/06/2018 10:45 PM

## 2018-11-11 ENCOUNTER — Ambulatory Visit: Payer: Self-pay | Admitting: Family Medicine

## 2018-11-11 ENCOUNTER — Telehealth: Payer: Self-pay

## 2018-11-11 NOTE — Telephone Encounter (Signed)
The patient was scheduled to see Dr. Martinique on 10/01/2018, 11/03/2018 and 11/11/2018. Patient was a no show for all 3 appointments.  Per Dr. Martinique she will no longer provide care as this patients PCP.

## 2018-11-11 NOTE — Telephone Encounter (Signed)
Dr. Martinique is requesting pt to be dismissed from her care as PCP. Pt may transfer care to another provider, pending approval by that provider.

## 2018-11-12 NOTE — Telephone Encounter (Signed)
Letter has been sent. Fee will be assessed.

## 2018-11-21 ENCOUNTER — Ambulatory Visit: Payer: Self-pay | Admitting: Family Medicine

## 2018-12-15 ENCOUNTER — Other Ambulatory Visit: Payer: Self-pay

## 2018-12-15 ENCOUNTER — Encounter (HOSPITAL_COMMUNITY): Payer: Self-pay | Admitting: Emergency Medicine

## 2018-12-15 ENCOUNTER — Inpatient Hospital Stay
Admission: RE | Admit: 2018-12-15 | Discharge: 2018-12-22 | DRG: 885 | Disposition: A | Discharge status: Home / Self Care | Payer: No Typology Code available for payment source | Source: Intra-hospital | Attending: Psychiatry | Admitting: Psychiatry

## 2018-12-15 ENCOUNTER — Emergency Department (HOSPITAL_COMMUNITY)
Admission: EM | Admit: 2018-12-15 | Discharge: 2018-12-15 | Disposition: A | Discharge status: Other Acute Inpatient Hospital | Payer: No Typology Code available for payment source | Attending: Emergency Medicine | Admitting: Emergency Medicine

## 2018-12-15 DIAGNOSIS — Z87891 Personal history of nicotine dependence: Secondary | ICD-10-CM

## 2018-12-15 DIAGNOSIS — Z20828 Contact with and (suspected) exposure to other viral communicable diseases: Secondary | ICD-10-CM | POA: Insufficient documentation

## 2018-12-15 DIAGNOSIS — F3113 Bipolar disorder, current episode manic without psychotic features, severe: Secondary | ICD-10-CM | POA: Diagnosis not present

## 2018-12-15 DIAGNOSIS — F419 Anxiety disorder, unspecified: Secondary | ICD-10-CM | POA: Diagnosis present

## 2018-12-15 DIAGNOSIS — R440 Auditory hallucinations: Secondary | ICD-10-CM | POA: Diagnosis present

## 2018-12-15 DIAGNOSIS — Z79899 Other long term (current) drug therapy: Secondary | ICD-10-CM | POA: Insufficient documentation

## 2018-12-15 DIAGNOSIS — F315 Bipolar disorder, current episode depressed, severe, with psychotic features: Secondary | ICD-10-CM | POA: Insufficient documentation

## 2018-12-15 DIAGNOSIS — F319 Bipolar disorder, unspecified: Secondary | ICD-10-CM | POA: Diagnosis present

## 2018-12-15 DIAGNOSIS — G4700 Insomnia, unspecified: Secondary | ICD-10-CM

## 2018-12-15 DIAGNOSIS — F29 Unspecified psychosis not due to a substance or known physiological condition: Secondary | ICD-10-CM | POA: Insufficient documentation

## 2018-12-15 LAB — CBC WITH DIFFERENTIAL/PLATELET
Abs Immature Granulocytes: 0.02 10*3/uL (ref 0.00–0.07)
Basophils Absolute: 0 10*3/uL (ref 0.0–0.1)
Basophils Relative: 1 %
Eosinophils Absolute: 0.3 10*3/uL (ref 0.0–0.5)
Eosinophils Relative: 4 %
HCT: 37.6 % (ref 36.0–46.0)
Hemoglobin: 12.5 g/dL (ref 12.0–15.0)
Immature Granulocytes: 0 %
Lymphocytes Relative: 28 %
Lymphs Abs: 2.2 10*3/uL (ref 0.7–4.0)
MCH: 30 pg (ref 26.0–34.0)
MCHC: 33.2 g/dL (ref 30.0–36.0)
MCV: 90.2 fL (ref 80.0–100.0)
Monocytes Absolute: 0.6 10*3/uL (ref 0.1–1.0)
Monocytes Relative: 8 %
Neutro Abs: 4.6 10*3/uL (ref 1.7–7.7)
Neutrophils Relative %: 59 %
Platelets: 251 10*3/uL (ref 150–400)
RBC: 4.17 MIL/uL (ref 3.87–5.11)
RDW: 11.9 % (ref 11.5–15.5)
WBC: 7.8 10*3/uL (ref 4.0–10.5)
nRBC: 0 % (ref 0.0–0.2)

## 2018-12-15 LAB — POC URINE PREG, ED: Preg Test, Ur: NEGATIVE

## 2018-12-15 LAB — COMPREHENSIVE METABOLIC PANEL
ALT: 15 U/L (ref 0–44)
AST: 14 U/L — ABNORMAL LOW (ref 15–41)
Albumin: 4.1 g/dL (ref 3.5–5.0)
Alkaline Phosphatase: 55 U/L (ref 38–126)
Anion gap: 9 (ref 5–15)
BUN: 11 mg/dL (ref 6–20)
CO2: 25 mmol/L (ref 22–32)
Calcium: 8.7 mg/dL — ABNORMAL LOW (ref 8.9–10.3)
Chloride: 106 mmol/L (ref 98–111)
Creatinine, Ser: 0.93 mg/dL (ref 0.44–1.00)
GFR calc Af Amer: >60 mL/min (ref >60)
GFR calc non Af Amer: >60 mL/min (ref >60)
Glucose, Bld: 125 mg/dL — ABNORMAL HIGH (ref 70–99)
Potassium: 3.8 mmol/L (ref 3.5–5.1)
Sodium: 140 mmol/L (ref 135–145)
Total Bilirubin: 0.7 mg/dL (ref 0.3–1.2)
Total Protein: 6.6 g/dL (ref 6.5–8.1)

## 2018-12-15 LAB — RAPID URINE DRUG SCREEN, HOSP PERFORMED
Amphetamines: NOT DETECTED
Barbiturates: NOT DETECTED
Benzodiazepines: NOT DETECTED
Cocaine: NOT DETECTED
Opiates: NOT DETECTED
Tetrahydrocannabinol: NOT DETECTED

## 2018-12-15 LAB — SARS CORONAVIRUS 2 BY RT PCR (HOSPITAL ORDER, PERFORMED IN ~~LOC~~ HOSPITAL LAB): SARS Coronavirus 2: NEGATIVE

## 2018-12-15 LAB — ETHANOL: Alcohol, Ethyl (B): <10 mg/dL (ref <10)

## 2018-12-15 MED ORDER — RISPERIDONE 1 MG PO TABS: 4.0000 mg | ORAL_TABLET | Freq: Every day | ORAL | Status: DC

## 2018-12-15 MED ORDER — CARBAMAZEPINE 100 MG PO CHEW: 200.0000 mg | CHEWABLE_TABLET | Freq: Two times a day (BID) | ORAL | Status: DC

## 2018-12-15 MED ORDER — RISPERIDONE 1 MG PO TABS
2.0000 mg | ORAL_TABLET | Freq: Every day | ORAL | Status: DC
  Administered 2018-12-15 – 2018-12-21 (×7): 2 mg via ORAL
  Filled 2018-12-15 (×7): qty 2

## 2018-12-15 MED ORDER — INFLUENZA VAC SPLIT QUAD 0.5 ML IM SUSY: 0.5000 mL | PREFILLED_SYRINGE | INTRAMUSCULAR | Status: DC

## 2018-12-15 MED ORDER — TEMAZEPAM 15 MG PO CAPS: 30.0000 mg | ORAL_CAPSULE | Freq: Every evening | ORAL | Status: DC | PRN

## 2018-12-15 MED ORDER — TEMAZEPAM 15 MG PO CAPS
30.0000 mg | ORAL_CAPSULE | Freq: Once | ORAL | Status: DC
  Filled 2018-12-15: qty 2

## 2018-12-15 MED ORDER — BENZTROPINE MESYLATE 1 MG PO TABS: 0.5000 mg | ORAL_TABLET | Freq: Two times a day (BID) | ORAL | Status: DC

## 2018-12-15 MED ORDER — NICOTINE 21 MG/24HR TD PT24
21.0000 mg | MEDICATED_PATCH | Freq: Every day | TRANSDERMAL | Status: DC
  Administered 2018-12-16 – 2018-12-22 (×6): 21 mg via TRANSDERMAL
  Filled 2018-12-15 (×6): qty 1

## 2018-12-15 MED ORDER — MAGNESIUM HYDROXIDE 400 MG/5ML PO SUSP: 30.0000 mL | Freq: Every day | ORAL | Status: DC | PRN

## 2018-12-15 MED ORDER — ALUM & MAG HYDROXIDE-SIMETH 200-200-20 MG/5ML PO SUSP: 30.0000 mL | ORAL | Status: DC | PRN

## 2018-12-15 MED ORDER — MELATONIN 5 MG PO TABS
5.0000 mg | ORAL_TABLET | Freq: Every day | ORAL | Status: DC
  Administered 2018-12-15 – 2018-12-21 (×7): 5 mg via ORAL
  Filled 2018-12-15 (×7): qty 1

## 2018-12-15 MED ORDER — ACETAMINOPHEN 325 MG PO TABS: 650.0000 mg | ORAL_TABLET | Freq: Four times a day (QID) | ORAL | Status: DC | PRN

## 2018-12-15 MED ORDER — MELATONIN 3 MG PO TABS: 5.0000 mg | ORAL_TABLET | Freq: Every day | ORAL | Status: DC

## 2018-12-15 MED ORDER — CARBAMAZEPINE 100 MG PO CHEW
200.0000 mg | CHEWABLE_TABLET | Freq: Two times a day (BID) | ORAL | Status: DC
  Filled 2018-12-15 (×6): qty 2

## 2018-12-15 NOTE — Consult Note (Signed)
   Patient Identification:  Laurie Hooper Date of Evaluation:  12/15/2018   History of Present Illness: Patient is a 28 year old female who presents to the ED on an involuntary petition done by her dad.  Per the petition, patient has been acting bizarre, is not sleeping, has a problem with substance use, has been living at different places and he also has concerned about her 62-year-old daughter.  Patient reports that she does not believe in psychotropic medication, adds that she is doing well with natural therapy. Patient reports that she is not sleeping at night, has only been sleeping 3 to 4 hours, seems to be smiling inappropriately, laughing at times, has pressured speech.  Patient states that she does not want to take psychotropic medication, list from place to place with her 43-year-old daughter.  Based on the collateral information and patient's presentation, patient needs inpatient psychiatric admission for stabilization and treatment  Past Psychiatric History: Bipolar disorder by history, history of noncompliance, was hospitalized in July 2020   Past Medical History:     Past Medical History:  Diagnosis Date  . Anxiety   . Medical history non-contributory   . Psychosis [facility])        Past Surgical History:  Procedure Laterality Date  . KNEE ARTHROSCOPY W/ ACL RECONSTRUCTION      Allergies: No Known Allergies  Current Medications:  Prior to Admission medications   Medication Sig Start Date End Date Taking? Authorizing Provider  VITAMIN D, CHOLECALCIFEROL, PO Take 100 mg by mouth daily.   Yes [provider]  benztropine (COGENTIN) 0.5 MG tablet Take 1 tablet (0.5 mg total) by mouth 2 (two) times daily. Patient not taking: Reported on 12/15/2018 07/31/18   Johnn Hai, MD  carbamazepine (TEGRETOL) 100 MG chewable tablet Chew 2 tablets (200 mg total) by mouth 2 (two) times a day. Patient not taking: Reported on 12/15/2018 07/31/18   Johnn Hai, MD  risperiDONE  (RISPERDAL) 4 MG tablet Take 1 tablet (4 mg total) by mouth at bedtime. Patient not taking: Reported on 12/15/2018 07/31/18   Johnn Hai, MD  temazepam (RESTORIL) 30 MG capsule Take 1 capsule (30 mg total) by mouth at bedtime as needed for sleep. Patient not taking: Reported on 12/15/2018 07/31/18   Johnn Hai, MD    Social History:    reports that she has quit smoking. Her smoking use included cigarettes. She has never used smokeless tobacco. She reports previous alcohol use. She reports that she does not use drugs.   Family History:    Family History  Problem Relation Age of Onset  . Diabetes Paternal Grandmother   . Kidney disease Father   . Thyroid disease Mother   . Cancer Neg Hx     Mental Status Examination/Evaluation: Patient is circumstantial and has pressured speech.  Patient has poor impulse control, is a risk for self and her 62-year-old.  Social worker to make a CPS report.  Patient needs inpatient psychiatric care.     DIAGNOSIS:   Bipolar disorder, most recent episode manic  Assessment/Plan: Recommend inpatient psychiatric admission

## 2018-12-15 NOTE — Progress Notes (Signed)
Patient sad and tearful but cooperative during admission assessment. Patient denies SI/HI at this time. Patient verbalized that she is positive for AH at times.Patient stated that her family is putting her in trouble. . Patient informed of fall risk status, fall risk assessed "low" at this time. Patient oriented to unit/staff/room. Patient denies any questions/concerns at this time. Patient safe on unit with Q15 minute checks for safety. Skin assessment and body search done,no contraband found.
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
## 2018-12-15 NOTE — BH Assessment (Signed)
Patient has been accepted to [facility].  Accepting physician is Dr. Claris Gower.  Attending Physician will be Dr. Weber Cooks.  Patient has been assigned to room 307, by [facility].  Call report to [phone].  Representative/Transfer Coordinator is [facility] ED Charge RN Patient pre-admitted by [facility] Patient Access Levada Dy)   [facility] Staff Judson Roch, [place]) made aware of acceptance.

## 2018-12-15 NOTE — ED Notes (Signed)
Pt given clean scrub bottoms, netted underwear and pad.Pt back in room cleaning counter with paper towles.  Pt also given graham crackers

## 2018-12-15 NOTE — ED Notes (Signed)
Pt continuously has random outburst of laughter ( pt responding to internal stimuli). Pt then rolls over looks at tech ( me) and then turns back over. Pt does not want light off at this time.

## 2018-12-15 NOTE — ED Provider Notes (Signed)
[facility] EMERGENCY DEPARTMENT Provider Note   CSN: [id number] Arrival date & time: 12/15/18  0132     History   Chief Complaint Chief Complaint  Patient presents with  . Medical Clearance    HPI Laurie Hooper is a 28 y.o. female.     HPI  This patient is a 28 year old female, she has a known history of bipolar depression, psychosis and noncompliance with medications.  She has a prior history of hearing voices, she has been seen in the emergency department multiple times and has been admitted to behavioral health in the past as well.  Most recently she was seen on November 06, 2018 and did not meet criteria for admission at that time.  She was referred to outpatient follow-up.  Reportedly the patient comes in this evening in the care of law enforcement after she was reported to be abusing opiates, having increasing psychosis, leaving the house and wandering with her child, reportedly taking her child out of the house without appropriate clothing, stating that she is on her period but not wanting to use a pad stating that it is natural to flow and bleeding on her close.  The patient is resistant to tell me anything at all, level 5 caveat applies secondary to acute psychiatric disturbance.  Involuntary commitment papers were filled out by the father of the patient who states that "she has a 12-year-old child, she has been acting like a zombie for 3 weeks, she will not respond to her child or the father, she will take her child out to unknown locations without proper clothing endangering the child.  Concern for the respondent as well as her 78-year-old daughter" "the patient is also paranoid on a regular basis she will leave the house 6 or 7 times every 30 minutes".  Again the patient is resistant to tell me anything.  Past Medical History:  Diagnosis Date  . Anxiety   . Medical history non-contributory   . Psychosis [facility])     Patient Active Problem List   Diagnosis Date Noted   . Bipolar depression ([place]) 07/28/2018  . Encounter for initial prescription of contraceptive pills 07/10/2017  . Dysmenorrhea, unspecified 07/10/2017  . Labor and delivery indication for care or intervention 06/13/2014  . Subchorionic hematoma in second trimester 01/14/2014  . Placental abnormality in first trimester 01/14/2014  . Chlamydia infection affecting pregnancy in first trimester, antepartum 11/25/2013  . Supervision of normal pregnancy 11/04/2013    Past Surgical History:  Procedure Laterality Date  . KNEE ARTHROSCOPY W/ ACL RECONSTRUCTION       OB History    Gravida  1   Para  1   Term  1   Preterm      AB      Living  1     SAB      TAB      Ectopic      Multiple  0   Live Births  1            Home Medications    Prior to Admission medications   Medication Sig Start Date End Date Taking? Authorizing Provider  benztropine (COGENTIN) 0.5 MG tablet Take 1 tablet (0.5 mg total) by mouth 2 (two) times daily. 07/31/18   Johnn Hai, MD  carbamazepine (TEGRETOL) 100 MG chewable tablet Chew 2 tablets (200 mg total) by mouth 2 (two) times a day. 07/31/18   Johnn Hai, MD  Melatonin 5 MG CAPS Take 5 mg by mouth  at bedtime.    [provider]  risperiDONE (RISPERDAL) 4 MG tablet Take 1 tablet (4 mg total) by mouth at bedtime. 07/31/18   Malvin JohnsFarah, Bernita Beckstrom, MD  temazepam (RESTORIL) 30 MG capsule Take 1 capsule (30 mg total) by mouth at bedtime as needed for sleep. 07/31/18   Malvin JohnsFarah, Amarra Sawyer, MD    Family History Family History  Problem Relation Age of Onset  . Diabetes Paternal Grandmother   . Kidney disease Father   . Thyroid disease Mother   . Cancer Neg Hx     Social History Social History   Tobacco Use  . Smoking status: Former Smoker    Types: Cigarettes  . Smokeless tobacco: Never Used  Substance Use Topics  . Alcohol use: Not Currently  . Drug use: No     Allergies   Patient has no known allergies.   Review of Systems Review of  Systems  Unable to perform ROS: Psychiatric disorder     Physical Exam Updated Vital Signs BP 138/88 (BP Location: Right Arm)   Pulse (!) 127   Temp 98.6 F (37 C) (Oral)   Resp 16   Ht 1.88 m (6\' 2" )   Wt 90.7 kg   LMP 12/14/2018   SpO2 99%   BMI 25.68 kg/m   Physical Exam Vitals signs and nursing note reviewed.  Constitutional:      General: She is not in acute distress.    Appearance: She is well-developed.  HENT:     Head: Normocephalic and atraumatic.     Mouth/Throat:     Pharynx: No oropharyngeal exudate.  Eyes:     General: No scleral icterus.       Right eye: No discharge.        Left eye: No discharge.     Conjunctiva/sclera: Conjunctivae normal.     Pupils: Pupils are equal, round, and reactive to light.  Neck:     Musculoskeletal: Normal range of motion and neck supple.     Thyroid: No thyromegaly.     Vascular: No JVD.  Cardiovascular:     Rate and Rhythm: Regular rhythm. Tachycardia present.     Heart sounds: Normal heart sounds. No murmur. No friction rub. No gallop.      Comments: Pulse is 105 on my exam Pulmonary:     Effort: Pulmonary effort is normal. No respiratory distress.     Breath sounds: Normal breath sounds. No wheezing or rales.  Abdominal:     General: Bowel sounds are normal. There is no distension.     Palpations: Abdomen is soft. There is no mass.     Tenderness: There is no abdominal tenderness.  Musculoskeletal: Normal range of motion.        General: No tenderness.  Lymphadenopathy:     Cervical: No cervical adenopathy.  Skin:    General: Skin is warm and dry.     Findings: No erythema or rash.  Neurological:     Mental Status: She is alert.     Coordination: Coordination normal.  Psychiatric:        Behavior: Behavior normal.     Comments: Bizarre affect, appears paranoid, does not make good eye contact      ED Treatments / Results  Labs (all labs ordered are listed, but only abnormal results are displayed) Labs  Reviewed  COMPREHENSIVE METABOLIC PANEL  ETHANOL  RAPID URINE DRUG SCREEN, HOSP PERFORMED  CBC WITH DIFFERENTIAL/PLATELET  POC URINE PREG, ED  POC URINE PREG,  ED    EKG None  Radiology No results found.  Procedures Procedures (including critical care time)  Medications Ordered in ED Medications  benztropine (COGENTIN) tablet 0.5 mg (has no administration in time range)  Melatonin TABS 4.5 mg (has no administration in time range)  risperiDONE (RISPERDAL) tablet 4 mg (has no administration in time range)  carbamazepine (TEGRETOL) chewable tablet 200 mg (has no administration in time range)  temazepam (RESTORIL) capsule 30 mg (has no administration in time range)     Initial Impression / Assessment and Plan / ED Course  I have reviewed the triage vital signs and the nursing notes.  Pertinent labs & imaging results that were available during my care of the patient were reviewed by me and considered in my medical decision making (see chart for details).        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  She does not appear to be responding to internal stimuli nor she acting paranoid.  At this time she is medically cleared and will need to be evaluated by psychiatry.  Home medications have been reconciled and ordered  I discussed the case with the behavioral health nurse practitioner, Ala Dach, at 2:45 AM, the patient meets criteria for inpatient placement, there is not currently room at behavioral health thus they will look for placement at another  facility.  At change of shift, care signed out to oncoming physician to follow-up recommendations and placement  Final Clinical Impressions(s) / ED Diagnoses   Final diagnoses:  Psychosis, unspecified psychosis type [facility])    ED Discharge Orders    None       Eber Hong, MD 12/15/18 0246

## 2018-12-15 NOTE — BHH Suicide Risk Assessment (Signed)
[facility] Admission Suicide Risk Assessment   Nursing information obtained from:    Demographic factors:    Current Mental Status:    Loss Factors:    Historical Factors:    Risk Reduction Factors:     Total Time spent with patient: 1 hour Principal Problem: Bipolar 1 disorder ([place]) Diagnosis:  Principal Problem:   Bipolar 1 disorder ([place])  Subjective Data: Patient seen and chart reviewed.  Patient admitted under involuntary commitment petition stating that she has been engaging in psychotic behavior and is a danger to herself or her daughter.  On interview today the patient denies any suicidal thought denies any homicidal thoughts and believes that she does not have a mental health problem.  Denies any feeling of being dangerous to herself or others.  Presents as lucid but admits to hallucinations and seems to probably still have been showing some poor judgment recently  Continued Clinical Symptoms:  Alcohol Use Disorder Identification Test Final Score (AUDIT): 1 The "Alcohol Use Disorders Identification Test", Guidelines for Use in Primary Care, Second Edition.  World Pharmacologist [facility]). Score between 0-7:  no or low risk or alcohol related problems. Score between 8-15:  moderate risk of alcohol related problems. Score between 16-19:  high risk of alcohol related problems. Score 20 or above:  warrants further diagnostic evaluation for alcohol dependence and treatment.   CLINICAL FACTORS:   Bipolar Disorder:   Mixed State   Musculoskeletal: Strength & Muscle Tone: within normal limits Gait & Station: normal Patient leans: N/A  Psychiatric Specialty Exam: Physical Exam  Nursing note and vitals reviewed. Constitutional: She appears well-developed and well-nourished.  HENT:  Head: Normocephalic and atraumatic.  Eyes: Pupils are equal, round, and reactive to light. Conjunctivae are normal.  Neck: Normal range of motion.  Cardiovascular: Regular rhythm and normal heart sounds.   Respiratory: Effort normal. No respiratory distress.  GI: Soft.  Musculoskeletal: Normal range of motion.  Neurological: She is alert.  Skin: Skin is warm and dry.  Psychiatric: Her affect is blunt. Her speech is delayed. She is slowed. Thought content is paranoid. She expresses impulsivity. She expresses no homicidal and no suicidal ideation. She exhibits abnormal recent memory.    Review of Systems  Constitutional: Negative.   HENT: Negative.   Eyes: Negative.   Respiratory: Negative.   Cardiovascular: Negative.   Gastrointestinal: Negative.   Musculoskeletal: Negative.   Skin: Negative.   Neurological: Negative.   Psychiatric/Behavioral: Positive for hallucinations. Negative for depression, memory loss, substance abuse and suicidal ideas. The patient has insomnia. The patient is not nervous/anxious.     Blood pressure 111/78, pulse 92, temperature 98.2 F (36.8 C), temperature source Oral, resp. rate 18, height 6\' 1"  (1.854 m), weight 91.2 kg, last menstrual period 12/14/2018, SpO2 100 %.Body mass index is 26.52 kg/m.  General Appearance: Casual  Eye Contact:  Fair  Speech:  Clear and Coherent and Slow  Volume:  Decreased  Mood:  Dysphoric  Affect:  Congruent  Thought Process:  Goal Directed  Orientation:  Full (Time, Place, and Person)  Thought Content:  Logical and Hallucinations: Auditory  Suicidal Thoughts:  No  Homicidal Thoughts:  No  Memory:  Immediate;   Fair Recent;   Fair Remote;   Fair  Judgement:  Impaired  Insight:  Shallow  Psychomotor Activity:  Decreased  Concentration:  Concentration: Fair  Recall:  [employer] of Knowledge:  Fair  Language:  Fair  Akathisia:  No  Handed:  Right  AIMS (  if indicated):     Assets:  Desire for Improvement Physical Health Resilience  ADL's:  Intact  Cognition:  WNL  Sleep:         COGNITIVE FEATURES THAT CONTRIBUTE TO RISK:  Closed-mindedness    SUICIDE RISK:   Minimal: No identifiable suicidal ideation.   Patients presenting with no risk factors but with morbid ruminations; may be classified as minimal risk based on the severity of the depressive symptoms  PLAN OF CARE: Patient admitted to psychiatric ward.  15-minute checks in place.  She agrees to restart antipsychotic medication.  Engage in individual and group therapy and assessment by treatment team.  I certify that inpatient services furnished can reasonably be expected to improve the patient's condition.   Mordecai Rasmussen, MD 12/15/2018, 5:11 PM

## 2018-12-15 NOTE — ED Notes (Signed)
TTS in progress Dr Marijean Bravo

## 2018-12-15 NOTE — Progress Notes (Signed)
CSW spoke with pt's father to gain additional collateral information. He voiced concern for his daughter's, "continued mental health decline" and stated that his hope would be that she received inpatient treatment. He shared that pt lives with her mother in law, father in law and 26 y/o son. While pt is in the hospital, the family is coordinating to take care of the child. Mr. Tsao believes that the child's biological father may ask for full custody. He also shared concern that pt does not take prescribed psychotropic medications or follow up with appointments.  CSW will collaborate with [facility] medical director.   Audree Camel, LCSW, [place] [facility] BHH/TTS [phone]

## 2018-12-15 NOTE — Tx Team (Signed)
Initial Treatment Plan 12/15/2018 5:19 PM [name] IOE:[id number]    PATIENT STRESSORS: Financial difficulties Marital or family conflict Medication change or noncompliance   PATIENT STRENGTHS: Average or above average intelligence Capable of independent living Communication skills Supportive family/friends   PATIENT IDENTIFIED PROBLEMS: Depression 12/15/2018  Non compliant with medications 12/15/2018                   DISCHARGE CRITERIA:  Ability to meet basic life and health needs Medical problems require only outpatient monitoring Verbal commitment to aftercare and medication compliance  PRELIMINARY DISCHARGE PLAN: Attend aftercare/continuing care group Return to previous living arrangement Return to previous work or school arrangements  PATIENT/FAMILY INVOLVEMENT: This treatment plan has been presented to and reviewed with the patient, Laurie Hooper, and/or family member,.  The patient and family have been given the opportunity to ask questions and make suggestions.  Merlene Morse, RN 12/15/2018, 5:19 PM

## 2018-12-15 NOTE — BH Assessment (Signed)
Tele Assessment Note   Patient Name: Laurie Hooper MRN: [id number] Referring Physician: Eber Hong, MD Location of Patient: [facility], [phone] Location of Provider: Behavioral Health TTS Department  Laurie Hooper is an 28 y.o. single female who presents unaccompanied to [facility] via law enforcement after being petitioned for involuntary commitment by her father, Rylan Kaufmann [phone]. Affidavit and petition states: "The respondent has an opiate problem. She has a child of four years of age. She has been acting like a zombie for at least three week. The respondent will not respond to the child or the petitioner, her father. She will take child out to unknown locations without proper clothing endangering the child. The petitioner is concerned for the respondent as well as her four year old daughter, his granddaughter. The respondent is also paranoid on a regular basis. She will go in and out of the house six or seven times in thirty minutes and acts nervous all the time."  Pt says she doesn't know why she was brought to the emergency department. Her speech is slightly pressured as she states that she is a single mother and under stress due to COVID, work, a miscarriage, bowel problems and other issues. Pt responds to many questions with "off and on" in what appears to be a reflexive manner. She states her mood is "off and on", her sleep is "off and on", her appetite is "off and on". Pt says she is depressed "off and on" and acknowledges symptoms including crying spells, social withdrawal, fatigue, irritability, decreased concentration and feelings of guilt, worthlessness and hopelessness. She denies current suicidal ideation. When asked if she has a history of suicide attempts, Pt responds, "That's personal. You should ask that question. What's in the past is in the past." She denies current homicidal ideation or history of violence. She denies ever experiencing auditory or  visual hallucinations, however Pt has a history of responding to hallucinations in the community and while inpatient at [facility]. Pt told EDP that she is on her period but not wanting to use a pad stating that it is natural to flow and bleeding on her clothes.  Pt denies any alcohol or substance use.  When asked where Pt resides, she states she is "nomadic." She says she has different family members she can stay with. 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 Pt says she doesn't take psychiatric medications because she is a Public affairs consultant" and knows psychology and what she should put in her body. Pt repeats several times that she is a single mother and under stress due to COVID.  Pt's medical record indicates she has a history of bipolar disorder with psychotic symptoms. She was inpatient at [facility] in July 2020. Pt was also assessed 11/06/18 due to anxiety, insomnia and auditory hallucination and referred to outpatient treatment but Pt appears to have no followed recommendation as she says she has no outpatient mental health providers.  TTS contact Pt's father/petitioner, [name] [phone]. He says Pt has been behaving bizarrely for at least three weeks. He says she has episodes of clarity but other times when she will stare blankly and not respond to her daughter or anyone else. He says she will sometimes laugh inappropriately. He states she acknowledge to her mother she was hearing voices and Pt has been seen talking to people who are not there. He says Pt left his house in the middle of the night and  arrived at her mother's house with Pt's daughter, who was not dressed for the cold, and had no explanation why she was there. When asked about his report of Pt's opiate use, father says that Pt had asked a relative for a Xanax or some marijuana. He also says she will sit in her car and he suspects "she may be doing something out there." He has no actual evidence that Pt is using  "opiates."  Pt is dressed in hospital scrubs, alert and oriented person, place and date. Pt speaks in a clear tone, at moderate volume and slightly pressured pace. Motor behavior appears normal. Eye contact is good. Pt's mood is anxious and affect is anxious and guarded. Thought process is coherent but at times circumstantial. Pt was cooperative throughout assessment. When asked where Pt would go if she was discharged from [place] she says she doesn't know.   Diagnosis: F31.5 Bipolar I disorder, Current or most recent episode depressed, With psychotic features  Past Medical History:  Past Medical History:  Diagnosis Date  . Anxiety   . Medical history non-contributory   . Psychosis [facility])     Past Surgical History:  Procedure Laterality Date  . KNEE ARTHROSCOPY W/ ACL RECONSTRUCTION      Family History:  Family History  Problem Relation Age of Onset  . Diabetes Paternal Grandmother   . Kidney disease Father   . Thyroid disease Mother   . Cancer Neg Hx     Social History:  reports that she has quit smoking. Her smoking use included cigarettes. She has never used smokeless tobacco. She reports previous alcohol use. She reports that she does not use drugs.  Additional Social History:  Alcohol / Drug Use Pain Medications: Denies abuse Prescriptions: Denies abuse Over the Counter: Denies abuse History of alcohol / drug use?: Yes(Pt has history of using marijuana.) Longest period of sobriety (when/how long): NA  CIWA: CIWA-Ar BP: 138/88 Pulse Rate: (!) 127 COWS:    Allergies: No Known Allergies  Home Medications: (Not in a hospital admission)   OB/GYN Status:  Patient's last menstrual period was 12/14/2018.  General Assessment Data Location of Assessment: [facility] TTS Assessment: In system Is this a Tele or Face-to-Face Assessment?: Tele Assessment Is this an Initial Assessment or a Re-assessment for this encounter?: Initial Assessment Patient Accompanied by:: N/A Language  Other than English: No Living Arrangements: Other (Comment)(Staying with grandmother) What gender do you identify as?: Female Marital status: Single Maiden name: NA Pregnancy Status: No Living Arrangements: Other relatives Can pt return to current living arrangement?: Yes Admission Status: Involuntary Petitioner: Family member Is patient capable of signing voluntary admission?: Yes Referral Source: Self/Family/Friend Insurance type: Self-pay     Crisis Care Plan Living Arrangements: Other relatives Legal Guardian: Other:(Self) Name of Psychiatrist: None Name of Therapist: None  Education Status Is patient currently in school?: No Is the patient employed, unemployed or receiving disability?: Employed  Risk to self with the past 6 months Suicidal Ideation: No Has patient been a risk to self within the past 6 months prior to admission? : No Suicidal Intent: No Has patient had any suicidal intent within the past 6 months prior to admission? : No Is patient at risk for suicide?: No Suicidal Plan?: No Has patient had any suicidal plan within the past 6 months prior to admission? : No Access to Means: No What has been your use of drugs/alcohol within the last 12 months?: Pt denies substance use. Previous Attempts/Gestures: Yes How many  times?: 0 Other Self Harm Risks: None Triggers for Past Attempts: None known Intentional Self Injurious Behavior: None Family Suicide History: No Recent stressful life event(s): Other (Comment)(COVID restrictions, work stress) Persecutory voices/beliefs?: No Depression: Yes Depression Symptoms: Despondent, Insomnia, Tearfulness, Isolating, Fatigue, Guilt, Loss of interest in usual pleasures, Feeling worthless/self pity, Feeling angry/irritable Substance abuse history and/or treatment for substance abuse?: No Suicide prevention information given to non-admitted patients: Not applicable  Risk to Others within the past 6 months Homicidal  Ideation: No Does patient have any lifetime risk of violence toward others beyond the six months prior to admission? : No Thoughts of Harm to Others: No Current Homicidal Intent: No Current Homicidal Plan: No Access to Homicidal Means: No Identified Victim: None History of harm to others?: No Assessment of Violence: None Noted Violent Behavior Description: None known Does patient have access to weapons?: No Criminal Charges Pending?: No Does patient have a court date: No Is patient on probation?: No  Psychosis Hallucinations: Auditory Delusions: Unspecified  Mental Status Report Appearance/Hygiene: In scrubs Eye Contact: Good Motor Activity: Freedom of movement Speech: Other (Comment)(Slightly pressured) Level of Consciousness: Alert Mood: Other (Comment)("Off and on") Affect: Anxious, Other (Comment)(guarded) Anxiety Level: Moderate Thought Processes: Circumstantial Judgement: Impaired Orientation: Person, Place, Time Obsessive Compulsive Thoughts/Behaviors: None  Cognitive Functioning Concentration: Poor Memory: Recent Intact, Remote Impaired Is patient IDD: No Insight: Poor Impulse Control: Poor Appetite: Fair Have you had any weight changes? : No Change Sleep: Decreased Total Hours of Sleep: 5 Vegetative Symptoms: None  ADLScreening [facility] Assessment Services) Patient's cognitive ability adequate to safely complete daily activities?: Yes Patient able to express need for assistance with ADLs?: Yes Independently performs ADLs?: Yes (appropriate for developmental age)  Prior Inpatient Therapy Prior Inpatient Therapy: Yes Prior Therapy Dates: 07/28/18 Prior Therapy Facilty/Provider(s): [facility] Reason for Treatment: Psychosis  Prior Outpatient Therapy Does patient have an ACCT team?: No Does patient have Intensive In-House Services?  : No Does patient have [facility] services? : No Does patient have P4CC services?: No  ADL Screening (condition at time of  admission) Patient's cognitive ability adequate to safely complete daily activities?: Yes Is the patient deaf or have difficulty hearing?: No Does the patient have difficulty seeing, even when wearing glasses/contacts?: No Does the patient have difficulty concentrating, remembering, or making decisions?: Yes Patient able to express need for assistance with ADLs?: Yes Does the patient have difficulty dressing or bathing?: No Independently performs ADLs?: Yes (appropriate for developmental age) Does the patient have difficulty walking or climbing stairs?: No Weakness of Legs: None Weakness of Arms/Hands: None       Abuse/Neglect Assessment (Assessment to be complete while patient is alone) Abuse/Neglect Assessment Can Be Completed: Unable to assess, patient is non-responsive or altered mental status(Pt refused to answer.)     Advance Directives (For Healthcare) Does Patient Have a Medical Advance Directive?: No Would patient like information on creating a medical advance directive?: No - Patient declined          Disposition: Binnie Rail, [facility], confirmed adult unit is currently at capacity. Gave clinical report to Nira Conn, NP who said Pt meets criteria for inpatient psychiatric treatment. TTS will contact other facilities for placement. Notified Dr Eber Hong and Georgann Housekeeper, RN of recommendation.  Disposition Initial Assessment Completed for this Encounter: Yes  This service was provided via telemedicine using a 2-way, interactive audio and video technology.  Names of all persons participating in this telemedicine service and their role in this  encounter. Name: Michael LitterKhadejah Karam Role: Patient  Name: Chrystie Noseavid Poland Role: Pt's father/petitioner  Name: Shela CommonsFord Oval Moralez Jr, [facility] Role: TTS counselor      Harlin RainFord Ellis Patsy BaltimoreWarrick Jr, [facility], [facility] Triage Specialist [phone]  Pamalee LeydenWarrick Jr, Tricia Pledger Ellis 12/15/2018 3:04 AM

## 2018-12-15 NOTE — ED Notes (Signed)
Communications called to send transport for patient to [facility].

## 2018-12-15 NOTE — ED Notes (Signed)
Pt dressed out in scrubs. Pt left ankle shackle to bed.Pt belongings ( 1 bag)  placed in locker.

## 2018-12-16 DIAGNOSIS — F319 Bipolar disorder, unspecified: Secondary | ICD-10-CM | POA: Diagnosis not present

## 2018-12-16 LAB — LIPID PANEL
Cholesterol: 155 mg/dL (ref 0–200)
HDL: 36 mg/dL — ABNORMAL LOW (ref >40)
LDL Cholesterol: 104 mg/dL — ABNORMAL HIGH (ref 0–99)
Total CHOL/HDL Ratio: 4.3 RATIO
Triglycerides: 76 mg/dL (ref <150)
VLDL: 15 mg/dL (ref 0–40)

## 2018-12-16 LAB — HEPATIC FUNCTION PANEL
ALT: 13 U/L (ref 0–44)
AST: 13 U/L — ABNORMAL LOW (ref 15–41)
Albumin: 4.1 g/dL (ref 3.5–5.0)
Alkaline Phosphatase: 58 U/L (ref 38–126)
Bilirubin, Direct: 0.1 mg/dL (ref 0.0–0.2)
Indirect Bilirubin: 1.1 mg/dL — ABNORMAL HIGH (ref 0.3–0.9)
Total Bilirubin: 1.2 mg/dL (ref 0.3–1.2)
Total Protein: 7.2 g/dL (ref 6.5–8.1)

## 2018-12-16 LAB — TSH: TSH: 0.768 u[IU]/mL (ref 0.350–4.500)

## 2018-12-16 NOTE — Plan of Care (Signed)
Patient verbalized feelings to staff, she currently denies SI/HI/AVH .

## 2018-12-16 NOTE — Progress Notes (Signed)
Recreation Therapy Notes  INPATIENT RECREATION THERAPY ASSESSMENT  Patient Details Name: Laurie Hooper MRN: [id number] DOB: [birth year] Today's Date: 12/16/2018       Information Obtained From: Patient  Able to Participate in Assessment/Interview: Yes  Patient Presentation: Responsive  Reason for Admission (Per Patient): Active Symptoms  Patient Stressors:    Coping Skills:   Write, Engineer, site, Read  Leisure Interests (2+):  Individual - Reading, Individual - Writing, Art - Draw, [employer] - Shopping mall  Frequency of Recreation/Participation:    Awareness of Community Resources:     [employer]:     Current Use:    If no, Barriers?:    Expressed Interest in [place] of Residence:  [place]  Patient Main Form of Transportation: Musician  Patient Strengths:  My social skills  Patient Identified Areas of Improvement:  continue to be myself  Patient Goal for Hospitalization:  Work on my patience  Current SI (including self-harm):  No  Current HI:  No  Current AVH: No  Staff Intervention Plan: Group Attendance, Collaborate with Interdisciplinary Treatment Team  Consent to Intern Participation: N/A  Maicol Bowland 12/16/2018, 2:30 PM

## 2018-12-16 NOTE — BHH Suicide Risk Assessment (Signed)
[place] INPATIENT:  Family/Significant Other Suicide Prevention Education  Suicide Prevention Education:  Education Completed; Constance Whittle, father, [phone] has been identified by the patient as the family member/significant other with whom the patient will be residing, and identified as the person(s) who will aid the patient in the event of a mental health crisis (suicidal ideations/suicide attempt).  With written consent from the patient, the family member/significant other has been provided the following suicide prevention education, prior to the and/or following the discharge of the patient.  The suicide prevention education provided includes the following:  Suicide risk factors  Suicide prevention and interventions  National Suicide Hotline telephone number  [facility] assessment telephone number  [facility] Emergency Assistance [place] and/or Residential Mobile Crisis Unit telephone number  Request made of family/significant other to:  Remove weapons (e.g., guns, rifles, knives), all items previously/currently identified as safety concern.    Remove drugs/medications (over-the-counter, prescriptions, illicit drugs), all items previously/currently identified as a safety concern.  The family member/significant other verbalizes understanding of the suicide prevention education information provided.  The family member/significant other agrees to remove the items of safety concern listed above.  Rozann Lesches 12/16/2018, 11:34 AM

## 2018-12-16 NOTE — Tx Team (Addendum)
Interdisciplinary Treatment and Diagnostic Plan Update  12/16/2018 Time of Session: 9:00AM Laurie Hooper MRN: [id number]  Principal Diagnosis: Bipolar 1 disorder [facility])  Secondary Diagnoses: Principal Problem:   Bipolar 1 disorder ([place])   Current Medications:  Current Facility-Administered Medications  Medication Dose Route Frequency Provider Last Rate Last Dose  . acetaminophen (TYLENOL) tablet 650 mg  650 mg Oral Q6H PRN Cristofano, Dorene Ar, MD      . alum & mag hydroxide-simeth (MAALOX/MYLANTA) 200-200-20 MG/5ML suspension 30 mL  30 mL Oral Q4H PRN Cristofano, Paul A, MD      . influenza vac split quadrivalent PF (FLUARIX) injection 0.5 mL  0.5 mL Intramuscular Tomorrow-1000 Clapacs, John T, MD      . magnesium hydroxide (MILK OF MAGNESIA) suspension 30 mL  30 mL Oral Daily PRN Cristofano, Dorene Ar, MD      . Melatonin TABS 5 mg  5 mg Oral QHS Cristofano, Dorene Ar, MD   5 mg at 12/15/18 2158  . nicotine (NICODERM CQ - dosed in mg/24 hours) patch 21 mg  21 mg Transdermal Daily Clapacs, Madie Reno, MD   21 mg at 12/16/18 0825  . risperiDONE (RISPERDAL) tablet 2 mg  2 mg Oral QHS Clapacs, Madie Reno, MD   2 mg at 12/15/18 2158  . temazepam (RESTORIL) capsule 30 mg  30 mg Oral QHS PRN Cristofano, Dorene Ar, MD       PTA Medications: Medications Prior to Admission  Medication Sig Dispense Refill Last Dose  . benztropine (COGENTIN) 0.5 MG tablet Take 1 tablet (0.5 mg total) by mouth 2 (two) times daily. (Patient not taking: Reported on 12/15/2018) 60 tablet 2   . carbamazepine (TEGRETOL) 100 MG chewable tablet Chew 2 tablets (200 mg total) by mouth 2 (two) times a day. (Patient not taking: Reported on 12/15/2018) 120 tablet 1   . risperiDONE (RISPERDAL) 4 MG tablet Take 1 tablet (4 mg total) by mouth at bedtime. (Patient not taking: Reported on 12/15/2018) 90 tablet 1   . temazepam (RESTORIL) 30 MG capsule Take 1 capsule (30 mg total) by mouth at bedtime as needed for sleep. (Patient not taking:  Reported on 12/15/2018) 30 capsule 1   . VITAMIN D, CHOLECALCIFEROL, PO Take 100 mg by mouth daily.       Patient Stressors: Financial difficulties Marital or family conflict Medication change or noncompliance  Patient Strengths: Average or above average intelligence Capable of independent living Communication skills Supportive family/friends  Treatment Modalities: Medication Management, Group therapy, Case management,  1 to 1 session with clinician, Psychoeducation, Recreational therapy.   Physician Treatment Plan for Primary Diagnosis: Bipolar 1 disorder ([place]) Long Term Goal(s): Improvement in symptoms so as ready for discharge Improvement in symptoms so as ready for discharge   Short Term Goals: Ability to verbalize feelings will improve Ability to demonstrate self-control will improve Ability to identify and develop effective coping behaviors will improve Compliance with prescribed medications will improve  Medication Management: Evaluate patient's response, side effects, and tolerance of medication regimen.  Therapeutic Interventions: 1 to 1 sessions, Unit Group sessions and Medication administration.  Evaluation of Outcomes: Not Met  Physician Treatment Plan for Secondary Diagnosis: Principal Problem:   Bipolar 1 disorder ([place])  Long Term Goal(s): Improvement in symptoms so as ready for discharge Improvement in symptoms so as ready for discharge   Short Term Goals: Ability to verbalize feelings will improve Ability to demonstrate self-control will improve Ability to identify and develop effective coping behaviors will improve  Compliance with prescribed medications will improve     Medication Management: Evaluate patient's response, side effects, and tolerance of medication regimen.  Therapeutic Interventions: 1 to 1 sessions, Unit Group sessions and Medication administration.  Evaluation of Outcomes: Not Progressing   RN Treatment Plan for Primary Diagnosis:  Bipolar 1 disorder ([place]) Long Term Goal(s): Knowledge of disease and therapeutic regimen to maintain health will improve  Short Term Goals: Ability to demonstrate self-control, Ability to participate in decision making will improve, Ability to verbalize feelings will improve, Ability to disclose and discuss suicidal ideas and Ability to identify and develop effective coping behaviors will improve  Medication Management: RN will administer medications as ordered by provider, will assess and evaluate patient's response and provide education to patient for prescribed medication. RN will report any adverse and/or side effects to prescribing provider.  Therapeutic Interventions: 1 on 1 counseling sessions, Psychoeducation, Medication administration, Evaluate responses to treatment, Monitor vital signs and CBGs as ordered, Perform/monitor CIWA, COWS, AIMS and Fall Risk screenings as ordered, Perform wound care treatments as ordered.  Evaluation of Outcomes: Not Progressing   LCSW Treatment Plan for Primary Diagnosis: Bipolar 1 disorder ([place]) Long Term Goal(s): Safe transition to appropriate next level of care at discharge, Engage patient in therapeutic group addressing interpersonal concerns.  Short Term Goals: Engage patient in aftercare planning with referrals and resources, Increase social support, Increase ability to appropriately verbalize feelings, Increase emotional regulation and Facilitate acceptance of mental health diagnosis and concerns  Therapeutic Interventions: Assess for all discharge needs, 1 to 1 time with Social worker, Explore available resources and support systems, Assess for adequacy in community support network, Educate family and significant other(s) on suicide prevention, Complete Psychosocial Assessment, Interpersonal group therapy.  Evaluation of Outcomes: Not Progressing   Progress in Treatment: Attending groups: Yes. Participating in groups: Yes. Taking medication as  prescribed: Yes. Toleration medication: Yes. Family/Significant other contact made: No, will contact:  once permission is given. Patient understands diagnosis: Yes. Discussing patient identified problems/goals with staff: Yes. Medical problems stabilized or resolved: Yes. Denies suicidal/homicidal ideation: Yes. Issues/concerns per patient self-inventory: No. Other: none  New problem(s) identified: No, Describe:  none  New Short Term/Long Term Goal(s): elimination of symptoms of psychosis, medication management for mood stabilization; elimination of SI thoughts; development of comprehensive mental wellness plan.  Patient Goals:  "to work on my patience"  Discharge Plan or Barriers: Patient reports plans to return to her fathers home at discharge.  Patient declined for CSW to assist with scheduling aftercare appointments at this time, stating that she is an adult.  Pt did report plans to continue with [facility] and "get the once a month Schizophrenia shot".  Reason for Continuation of Hospitalization: Anxiety Depression Hallucinations Medication stabilization  Estimated Length of Stay: 1-7 days  Recreational Therapy: Patient: N/A Patient Goal: Patient will engage in groups without prompting or encouragement from LRT x3 group sessions within 5 recreation therapy group sessions  Attendees: Patient: Neave Lenger 12/16/2018 11:19 AM  Physician: Dr. Weber Cooks, MD 12/16/2018 11:19 AM  Nursing: Lyda Kalata, RN 12/16/2018 11:19 AM  RN Care Manager: 12/16/2018 11:19 AM  Social Worker: Assunta Curtis, LCSW 12/16/2018 11:19 AM  Recreational Therapist: Roanna Epley, Reather Converse, LRT 12/16/2018 11:19 AM  Other: Evalina Field, LCSW 12/16/2018 11:19 AM  Other:  12/16/2018 11:19 AM  Other: 12/16/2018 11:19 AM    Scribe for Treatment Team: Rozann Lesches, LCSW 12/16/2018 11:19 AM

## 2018-12-16 NOTE — BHH Counselor (Signed)
CSW met with the patient regarding aftercare, however, pt declined to sign consent for CSW to support with scheduling aftercare appointments.   Pt did confirm that she was going to [place], however, stated "I am old enough to make my own appointments" and would not sign consent.  Assunta Curtis, MSW, LCSW 12/16/2018 11:18 AM

## 2018-12-16 NOTE — Progress Notes (Signed)
Patient pleasant and appropriate. Guarded offers minimal. Reported from SW patient non sensical. Illogical. Patient denies any SI, HI, AVH. Visible in milieu. Eating meals well. Patient was able to answer questions appropriately.  Encouragement and support offered. Safety checks maintained. Medications given as prescribed. Pt receptive and remains safe on unit with q 15 min checks.

## 2018-12-16 NOTE — Progress Notes (Signed)
   12/16/18 2300  Psych Admission Type (Psych Patients Only)  Admission Status Involuntary  Psychosocial Assessment  Patient Complaints None  Eye Contact Fair  Facial Expression Flat  Affect Appropriate to circumstance  Speech Logical/coherent  Interaction Cautious  Motor Activity Other (Comment) (WDL)  Appearance/Hygiene Unremarkable  Behavior Characteristics Cooperative;Appropriate to situation  Mood Pleasant  Thought Process  Coherency WDL  Content WDL  Delusions WDL  Perception WDL  Hallucination None reported or observed  Judgment Impaired  Confusion WDL  Danger to Self  Current suicidal ideation? Denies  Danger to Others  Danger to Others None reported or observed   Pt is pleasant. Pt denies SI, HI, AVH. Pt denies pain although she says that she has been having trouble getting her bowels back to her normal. LBM=12/16/2018. Pt takes meds without problem and has no issues at this time.

## 2018-12-16 NOTE — Progress Notes (Signed)
Recreation Therapy Notes  Date: 12/16/2018  Time: 9:30 am  Location: Craft room  Behavioral response: Appropriate   Intervention Topic: Problem Solving   Discussion/Intervention:  Group content on today was focused on problem solving. The group described what problem solving is. Patients expressed how problems affect them and how they deal with problems. Individuals identified healthy ways to deal with problems. Patients explained what normally happens to them when they do not deal with problems. The group expressed reoccurring problems for them. The group participated in the intervention "Ways to Solve problems" where patients were given a chance to explore different ways to solve problems.  Clinical Observations/Feedback:  Patient came to group and was focused on what peers and staff had to say about problem solving. She was pulled from group by social work to complete an  assessment.  Chance Munter LRT/CTRS         Keeghan Mcintire 12/16/2018 11:21 AM

## 2018-12-16 NOTE — BHH Counselor (Signed)
CSW spoke with pt and informed her that her father would like for him to call her so that they can discuss her living arrangements and treatment.  CSW reminded pt to provide father with her code if she would like for him to be able to speak with her while on the unit.  Laurie Hooper, MSW, LCSW 12/16/2018 1:08 PM

## 2018-12-16 NOTE — Plan of Care (Signed)
  Problem: Education: Goal: Emotional status will improve Outcome: Progressing   Problem: Coping: Goal: Ability to interact with others will improve Outcome: Progressing   Problem: Education: Goal: Mental status will improve Outcome: Not Progressing
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
## 2018-12-16 NOTE — BHH Group Notes (Signed)
  LCSW Group Therapy Note  12/16/2018 11:39 AM   Type of Therapy/Topic:  Group Therapy:  Feelings about Diagnosis  Participation Level:  None   Description of Group:   This group will allow patients to explore their thoughts and feelings about diagnoses they have received. Patients will be guided to explore their level of understanding and acceptance of these diagnoses. Facilitator will encourage patients to process their thoughts and feelings about the reactions of others to their diagnosis and will guide patients in identifying ways to discuss their diagnosis with significant others in their lives. This group will be process-oriented, with patients participating in exploration of their own experiences, giving and receiving support, and processing challenge from other group members.   Therapeutic Goals: 1. Patient will demonstrate understanding of diagnosis as evidenced by identifying two or more symptoms of the disorder 2. Patient will be able to express two feelings regarding the diagnosis 3. Patient will demonstrate their ability to communicate their needs through discussion and/or role play  Summary of Patient Progress: Pt was present in group but did not engage in the discussion at all.    Therapeutic Modalities:   Cognitive Behavioral Therapy Brief Therapy Feelings Identification    Evalina Field, MSW, LCSW Clinical Social Work 12/16/2018 11:39 AM

## 2018-12-16 NOTE — Progress Notes (Signed)
Patient alert and oriented x 4, affect is flat, thoughts are organized and coherent, she isolates to self in her room, appears less anxious, she  forwards very little information , minimal interaction with peers and staff, she denies SI/HI/AVH interacting appropriately with staff and peers, she was complaint with medication regimen, no bizarre behavior noted during this shift. 15 minutes safety checks maintained will continue to monitor.
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
## 2018-12-17 DIAGNOSIS — F319 Bipolar disorder, unspecified: Secondary | ICD-10-CM | POA: Diagnosis not present

## 2018-12-17 NOTE — Progress Notes (Signed)
Recreation Therapy Notes  Date: 12/17/2018  Time: 9:30 am  Location: Craft room  Behavioral response: Appropriate   Intervention Topic: Stress  Discussion/Intervention:  Group content on today was focused on stress. The group defined stress and way to cope with stress. Participants expressed how they know when they are stresses out. Individuals described the different ways they have to cope with stress. The group stated reasons why it is important to cope with stress. Patient explained what good stress is and some examples. The group participated in the intervention "Stress Management". Individuals were separated into two group and answered questions related to stress.  Clinical Observations/Feedback:  Patient came to group and defined stress as an every day circumstance and trying to maintain your happiness. She stated that she deals with her stress by crafting and drawing. Participant identified common stressors as holidays, family and time. Individual was social with peers and staff while participating in group.   Salbador Fiveash LRT/CTRS         Tiffannie Sloss 12/17/2018 12:03 PM

## 2018-12-17 NOTE — B[facility] Group Notes (Signed)
LCSW Group Therapy Note  12/17/2018 1:00 PM  Type of Therapy/Topic:  Group Therapy:  Emotion Regulation  Participation Level:  Minimal   Description of Group:   The purpose of this group is to assist patients in learning to regulate negative emotions and experience positive emotions. Patients will be guided to discuss ways in which they have been vulnerable to their negative emotions. These vulnerabilities will be juxtaposed with experiences of positive emotions or situations, and patients will be challenged to use positive emotions to combat negative ones. Special emphasis will be placed on coping with negative emotions in conflict situations, and patients will process healthy conflict resolution skills.  Therapeutic Goals: 1. Patient will identify two positive emotions or experiences to reflect on in order to balance out negative emotions 2. Patient will label two or more emotions that they find the most difficult to experience 3. Patient will demonstrate positive conflict resolution skills through discussion and/or role plays  Summary of Patient Progress: Patient was present in group. Patient did not engage in group discussion. Patient appeared to e responding to internal stimuli in group as evidenced by laughing at inappropriate times and when the nature of the conversation did not support it.  Patient often hid her face while laughing.    Therapeutic Modalities:   Cognitive Behavioral Therapy Feelings Identification Dialectical Behavioral Therapy  Assunta Curtis, MSW, LCSW 12/17/2018 11:42 AM

## 2018-12-17 NOTE — Plan of Care (Signed)
Pt rates depression 5/10, anxiety 4/10 and hopelessness 6/10. Pt denies SI, HI and AVH. Pt was educated on care plan and verbalizes understanding. Collier Bullock RN Problem: Education: Goal: Freight forwarder Education information/materials will improve Outcome: Progressing Goal: Emotional status will improve Outcome: Progressing Goal: Mental status will improve Outcome: Progressing Goal: Verbalization of understanding the information provided will improve Outcome: Progressing   Problem: Health Behavior/Discharge Planning: Goal: Identification of resources available to assist in meeting health care needs will improve Outcome: Progressing Goal: Compliance with treatment plan for underlying cause of condition will improve Outcome: Progressing   Problem: Coping: Goal: Ability to identify and develop effective coping behavior will improve Outcome: Progressing Goal: Ability to interact with others will improve Outcome: Progressing   Problem: Self-Concept: Goal: Will verbalize positive feelings about self Outcome: Progressing

## 2018-12-17 NOTE — Progress Notes (Signed)
[facility] MD Progress Note  12/17/2018 2:38 PM Laurie Hooper  MRN:  [id number] Subjective: Patient seen and chart reviewed.  Patient and interview with me today denied any symptoms.  Denied any hallucinations.  She reported that her mood was feeling fine.  Looking through the group notes I see that she is still showing some odd behavior possibly indicative of psychosis during some of the groups.  She has been compliant with medicine.  Seems to be taking care of her ADLs adequately.  Affect when talking with me was somewhat blunted but not frankly bizarre.  Patient says she is thinking about discharge and planning to stay with her father after she gets out of the hospital. Principal Problem: Bipolar 1 disorder (HCC) Diagnosis: Principal Problem:   Bipolar 1 disorder (HCC)  Total Time spent with patient: 30 minutes  Past Psychiatric History: Patient has a history of prior evaluations and treatment for psychotic symptoms  Past Medical History:  Past Medical History:  Diagnosis Date  . Anxiety   . Medical history non-contributory   . Psychosis [facility])     Past Surgical History:  Procedure Laterality Date  . KNEE ARTHROSCOPY W/ ACL RECONSTRUCTION     Family History:  Family History  Problem Relation Age of Onset  . Diabetes Paternal Grandmother   . Kidney disease Father   . Thyroid disease Mother   . Cancer Neg Hx    Family Psychiatric  History: See previous Social History:  Social History   Substance and Sexual Activity  Alcohol Use Not Currently     Social History   Substance and Sexual Activity  Drug Use No    Social History   Socioeconomic History  . Marital status: Single    Spouse name: Not on file  . Number of children: Not on file  . Years of education: Not on file  . Highest education level: Not on file  Occupational History  . Not on file  Social Needs  . Financial resource strain: Not on file  . Food insecurity    Worry: Not on file    Inability: Not on  file  . Transportation needs    Medical: Not on file    Non-medical: Not on file  Tobacco Use  . Smoking status: Former Smoker    Types: Cigarettes  . Smokeless tobacco: Never Used  Substance and Sexual Activity  . Alcohol use: Not Currently  . Drug use: No  . Sexual activity: Yes    Birth control/protection: None  Lifestyle  . Physical activity    Days per week: 3 days    Minutes per session: 30 min  . Stress: Not on file  Relationships  . Social Musician on phone: Not on file    Gets together: Not on file    Attends religious service: Not on file    Active member of club or organization: Not on file    Attends meetings of clubs or organizations: Not on file    Relationship status: Not on file  Other Topics Concern  . Not on file  Social History Narrative  . Not on file   Additional Social History:                         Sleep: Fair  Appetite:  Fair  Current Medications: Current Facility-Administered Medications  Medication Dose Route Frequency Provider Last Rate Last Dose  . acetaminophen (TYLENOL) tablet 650 mg  650 mg Oral Q6H PRN Cristofano, Worthy RancherPaul A, MD      . alum & mag hydroxide-simeth (MAALOX/MYLANTA) 200-200-20 MG/5ML suspension 30 mL  30 mL Oral Q4H PRN Cristofano, Paul A, MD      . influenza vac split quadrivalent PF (FLUARIX) injection 0.5 mL  0.5 mL Intramuscular Tomorrow-1000 Clapacs, John T, MD      . magnesium hydroxide (MILK OF MAGNESIA) suspension 30 mL  30 mL Oral Daily PRN Cristofano, Worthy RancherPaul A, MD      . Melatonin TABS 5 mg  5 mg Oral QHS Cristofano, Worthy RancherPaul A, MD   5 mg at 12/16/18 2134  . nicotine (NICODERM CQ - dosed in mg/24 hours) patch 21 mg  21 mg Transdermal Daily Clapacs, Jackquline DenmarkJohn T, MD   21 mg at 12/16/18 0825  . risperiDONE (RISPERDAL) tablet 2 mg  2 mg Oral QHS Clapacs, Jackquline DenmarkJohn T, MD   2 mg at 12/16/18 2134  . temazepam (RESTORIL) capsule 30 mg  30 mg Oral QHS PRN Cristofano, Worthy RancherPaul A, MD        Lab Results:  Results for  orders placed or performed during the hospital encounter of 12/15/18 (from the past 48 hour(s))  Hepatic function panel     Status: Abnormal   Collection Time: 12/16/18  8:11 AM  Result Value Ref Range   Total Protein 7.2 6.5 - 8.1 g/dL   Albumin 4.1 3.5 - 5.0 g/dL   AST 13 (L) 15 - 41 U/L   ALT 13 0 - 44 U/L   Alkaline Phosphatase 58 38 - 126 U/L   Total Bilirubin 1.2 0.3 - 1.2 mg/dL   Bilirubin, Direct 0.1 0.0 - 0.2 mg/dL   Indirect Bilirubin 1.1 (H) 0.3 - 0.9 mg/dL    Comment: Performed at [facility], [address] [place], [place] [address]  Lipid panel     Status: Abnormal   Collection Time: 12/16/18  8:11 AM  Result Value Ref Range   Cholesterol 155 0 - 200 mg/dL   Triglycerides 76 <604<150 mg/dL   HDL 36 (L) >54>40 mg/dL   Total CHOL/HDL Ratio 4.3 RATIO   VLDL 15 0 - 40 mg/dL   LDL Cholesterol 098104 (H) 0 - 99 mg/dL    Comment:        Total Cholesterol/HDL:CHD Risk Coronary Heart Disease Risk Table                     Men   Women  1/2 Average Risk   3.4   3.3  Average Risk       5.0   4.4  2 X Average Risk   9.6   7.1  3 X Average Risk  23.4   11.0        Use the calculated Patient Ratio above and the CHD Risk Table to determine the patient's CHD Risk.        ATP III CLASSIFICATION (LDL):  <100     mg/dL   Optimal  [id number]  mg/dL   Near or Above                    Optimal  130-159  mg/dL   Borderline  [id number]  mg/dL   High  >130>190     mg/dL   Very High Performed at [facility], [address] [place], [place] [address]   TSH     Status: None   Collection Time: 12/16/18  8:11 AM  Result Value Ref Range   TSH 0.768 0.350 - 4.500 uIU/mL    Comment: Performed by a 3rd Generation assay with a functional sensitivity of <=0.01 uIU/mL. Performed at [facility], [address] [place], [place] [address]     Blood Alcohol level:  Lab Results  Component Value Date   [facility] <10 12/15/2018   ETH <10 11/06/2018    Metabolic Disorder  Labs: Lab Results  Component Value Date   HGBA1C 5.3 07/29/2018   MPG 105.41 07/29/2018   Lab Results  Component Value Date   PROLACTIN 107.9 (H) 07/29/2018   Lab Results  Component Value Date   CHOL 155 12/16/2018   TRIG 76 12/16/2018   HDL 36 (L) 12/16/2018   CHOLHDL 4.3 12/16/2018   VLDL 15 12/16/2018   LDLCALC 104 (H) 12/16/2018   LDLCALC 123 (H) 07/29/2018    Physical Findings: AIMS:  , ,  ,  ,    CIWA:    COWS:     Musculoskeletal: Strength & Muscle Tone: within normal limits Gait & Station: normal Patient leans: N/A  Psychiatric Specialty Exam: Physical Exam  Nursing note and vitals reviewed. Constitutional: She appears well-developed and well-nourished.  HENT:  Head: Normocephalic and atraumatic.  Eyes: Pupils are equal, round, and reactive to light. Conjunctivae are normal.  Neck: Normal range of motion.  Cardiovascular: Regular rhythm and normal heart sounds.  Respiratory: Effort normal. No respiratory distress.  GI: Soft.  Musculoskeletal: Normal range of motion.  Neurological: She is alert.  Skin: Skin is warm and dry.  Psychiatric: Her affect is blunt. Her speech is delayed. She is slowed. Thought content is not paranoid. Cognition and memory are normal. She expresses impulsivity. She expresses no homicidal and no suicidal ideation.    Review of Systems  Constitutional: Negative.   HENT: Negative.   Eyes: Negative.   Respiratory: Negative.   Cardiovascular: Negative.   Gastrointestinal: Negative.   Musculoskeletal: Negative.   Skin: Negative.   Neurological: Negative.   Psychiatric/Behavioral: Negative for depression, hallucinations, memory loss, substance abuse and suicidal ideas. The patient is not nervous/anxious and does not have insomnia.     Blood pressure (!) 124/94, pulse (!) 116, temperature 97.7 F (36.5 C), temperature source Oral, resp. rate 17, height 6\' 1"  (1.854 m), weight 91.2 kg, last menstrual period 12/14/2018, SpO2 98 %.Body  mass index is 26.52 kg/m.  General Appearance: Casual  Eye Contact:  Fair  Speech:  Slow  Volume:  Decreased  Mood:  Euthymic  Affect:  Constricted  Thought Process:  Coherent  Orientation:  Full (Time, Place, and Person)  Thought Content:  Tangential  Suicidal Thoughts:  No  Homicidal Thoughts:  No  Memory:  Immediate;   Fair Recent;   Fair Remote;   Fair  Judgement:  Fair  Insight:  Shallow  Psychomotor Activity:  Normal  Concentration:  Concentration: Fair  Recall:  14/06/2018 of Knowledge:  Fair  Language:  Fair  Akathisia:  No  Handed:  Right  AIMS (if indicated):     Assets:  Desire for Improvement Physical Health Resilience Social Support  ADL's:  Intact  Cognition:  Impaired,  Mild  Sleep:  Number of Hours: 4.15     Treatment Plan Summary: Daily contact with patient to assess and evaluate symptoms and progress in treatment, Medication management and Plan Patient seems to be for the most part calm possibly still having some psychotic symptoms but denying it when talking to me.  Not acting out in any obvious way.  I talked with her about her symptoms and the reason we are using medication.  Encouraged her to continue attending groups try and eat right stay well-hydrated and we will reassess tomorrow.  Possible discharge in a couple of days.  I am not going to change the dose of the medicine for now since she is agreeing to take it.  Alethia Berthold, MD 12/17/2018, 2:38 PM

## 2018-12-18 NOTE — Plan of Care (Signed)
  Problem: Education: Goal: Knowledge of [facility] Education information/materials will improve Outcome: Progressing Goal: Emotional status will improve Outcome: Progressing Goal: Mental status will improve Outcome: Progressing Goal: Verbalization of understanding the information provided will improve Outcome: Progressing  D: Patient has been quiet, pleasant and cooperative. Forwards very little in conversation. Out in the dayroom. Denies SI, HI and AVH A: Continue to monitor for safety R: Safety maintained.

## 2018-12-18 NOTE — Plan of Care (Signed)
°  Problem: Education: °Goal: Emotional status will improve °Outcome: Progressing °Goal: Mental status will improve °Outcome: Progressing °Goal: Verbalization of understanding the information provided will improve °Outcome: Progressing °  °

## 2018-12-18 NOTE — Progress Notes (Signed)
Recreation Therapy Notes   Date: 12/18/2018  Time: 9:30 am  Location: Craft room  Behavioral response: Appropriate  Intervention Topic: Leisure    Discussion/Intervention:  Group content today was focused on leisure. The group defined what leisure is and some positive leisure activities they participate in. Individuals identified the difference between good and bad leisure. Participants expressed how they feel after participating in the leisure of their choice. The group discussed how they go about picking a leisure activity and if others are involved in their leisure activities. The patient stated how many leisure activities they have to choose from and reasons why it is important to have leisure time. Individuals participated in the intervention "Exploration of Leisure" where they had a chance to identify new leisure activities as well as benefits of leisure. Clinical Observations/Feedback:  Patient came to group late due to unknown reasons. She stated that leisure is important to stay active and to do things. Participant explained that she enjoys crafting in her leisure time. Individual was social with peers and staff in group while participating in the intervention.  Delorice Bannister LRT/CTRS         Ulrick Methot 12/18/2018 11:35 AM

## 2018-12-18 NOTE — Progress Notes (Signed)
   12/18/18 2300  Psych Admission Type (Psych Patients Only)  Admission Status Involuntary  Psychosocial Assessment  Patient Complaints None  Eye Contact Fair  Facial Expression Flat  Affect Blunted  Speech Logical/coherent  Interaction Cautious  Motor Activity Other (Comment) (WNL)  Appearance/Hygiene Unremarkable  Behavior Characteristics Cooperative;Calm  Mood Pleasant  Aggressive Behavior  Effect No apparent injury  Thought Process  Coherency WDL  Content WDL  Delusions None reported or observed  Perception WDL  Hallucination None reported or observed  Judgment WDL  Confusion WDL  Danger to Self  Current suicidal ideation? Denies  Danger to Others  Danger to Others None reported or observed   Pt is calm and cooperative. Pt denies SI, HI and AVH as well as pain. When asked is she believes she is ready for discharge, pt states, "I guess so." Pt takes meds well without complaint. Pt remains to herself when not required to be involved in group activities.

## 2018-12-18 NOTE — BHH Group Notes (Signed)
[place] Group Notes:  (Nursing/MHT/Case Management/Adjunct)  Date:  12/18/2018  Time:  8:30 PM  Type of Therapy:  Group Therapy  Participation Level:  Active  Participation Quality:  Appropriate  Affect:  Appropriate  Cognitive:  Alert  Insight:  Good  Engagement in Group:  Engaged  Modes of Intervention:  Support  Summary of Progress/Problems:  Laurie Hooper 12/18/2018, 8:30 PM

## 2018-12-18 NOTE — BHH Counselor (Signed)
CSW spoke with the patient about contacting to her father regarding her going to his home at discharge.  Pt reports that she has not talked to her father and staying with her father has not been confirmed.    Assunta Curtis, MSW, LCSW 12/18/2018 3:17 PM

## 2018-12-18 NOTE — Plan of Care (Signed)
Pt denies depression, anxiety, SI, HI and AVH. Pt was educated on care plan and verbalizes understanding. Collier Bullock RN Problem: Education: Goal: Freight forwarder Education information/materials will improve Outcome: Progressing Goal: Emotional status will improve Outcome: Progressing Goal: Mental status will improve Outcome: Progressing Goal: Verbalization of understanding the information provided will improve Outcome: Progressing   Problem: Health Behavior/Discharge Planning: Goal: Identification of resources available to assist in meeting health care needs will improve Outcome: Progressing Goal: Compliance with treatment plan for underlying cause of condition will improve Outcome: Progressing   Problem: Coping: Goal: Ability to identify and develop effective coping behavior will improve Outcome: Progressing Goal: Ability to interact with others will improve Outcome: Progressing   Problem: Self-Concept: Goal: Will verbalize positive feelings about self Outcome: Progressing

## 2018-12-18 NOTE — B[facility] Group Notes (Signed)
LCSW Group Therapy Note  12/18/2018 1:58 PM  Type of Therapy/Topic:  Group Therapy:  Balance in Life  Participation Level:  Minimal  Description of Group:    This group will address the concept of balance and how it feels and looks when one is unbalanced. Patients will be encouraged to process areas in their lives that are out of balance and identify reasons for remaining unbalanced. Facilitators will guide patients in utilizing problem-solving interventions to address and correct the stressor making their life unbalanced. Understanding and applying boundaries will be explored and addressed for obtaining and maintaining a balanced life. Patients will be encouraged to explore ways to assertively make their unbalanced needs known to significant others in their lives, using other group members and facilitator for support and feedback.  Therapeutic Goals: 1. Patient will identify two or more emotions or situations they have that consume much of in their lives. 2. Patient will identify signs/triggers that life has become out of balance:  3. Patient will identify two ways to set boundaries in order to achieve balance in their lives:  4. Patient will demonstrate ability to communicate their needs through discussion and/or role plays  Summary of Patient Progress: Pt was present in group and participated at the end of the group to discuss things she needs to do to maintain more balance in her life stating that personal growth and relationships are the ones that are most important to her.     Therapeutic Modalities:   Cognitive Behavioral Therapy Solution-Focused Therapy Assertiveness Training  Evalina Field, MSW, LCSW Clinical Social Work 12/18/2018 1:58 PM

## 2018-12-18 NOTE — Progress Notes (Signed)
[facility] MD Progress Note  12/18/2018 2:18 PM Laurie Hooper  MRN:  [id number]   Subjective: This is a follow-up on this patient with bipolar 1 disorder.  Patient reports today that she is feeling good.  Patient denies having any suicidal or homicidal ideations and denies having any hallucinations.  She states that she really likes the medications that she is getting while she is here because it is helping her sleep really well.  She states that she does not feel paranoid or angry or agitated.  She states that she has been enjoying going to groups.  She states that she feels much better and she is hoping to be discharged soon that she can return home to her child.  Patient states that she only has one complaint and that is where she is being checked on every 15 minutes and someone will walk in to see where she is at and she is using the restroom.  She states that she feels that this is a private situation for her to use the restroom.  Principal Problem: Bipolar 1 disorder (HCC) Diagnosis: Principal Problem:   Bipolar 1 disorder ([facility])  Total Time spent with patient: 20 minutes  Past Psychiatric History: Patient had a previous psychiatric hospitalization for several days in July of this year.  She was diagnosed with bipolar disorder and discharged on a combination of Tegretol and Risperdal.  Patient says she did follow-up with [facility] but in the last month has tapered herself off the medicine and feels better for it because she feels like she can think more clearly.  Denies any history of suicide attempts.  Had seen a counselor and had some psychiatric treatment with a diagnosis of bipolar prior to her hospitalization.  Past Medical History:  Past Medical History:  Diagnosis Date  . Anxiety   . Medical history non-contributory   . Psychosis [facility])     Past Surgical History:  Procedure Laterality Date  . KNEE ARTHROSCOPY W/ ACL RECONSTRUCTION     Family History:  Family History  Problem  Relation Age of Onset  . Diabetes Paternal Grandmother   . Kidney disease Father   . Thyroid disease Mother   . Cancer Neg Hx    Family Psychiatric  History: Patient seems to have only a vague idea of this.  She says she thinks her grandfather has mental health problems but she cannot specify in what way. Social History:  Social History   Substance and Sexual Activity  Alcohol Use Not Currently     Social History   Substance and Sexual Activity  Drug Use No    Social History   Socioeconomic History  . Marital status: Single    Spouse name: Not on file  . Number of children: Not on file  . Years of education: Not on file  . Highest education level: Not on file  Occupational History  . Not on file  Tobacco Use  . Smoking status: Former Smoker    Types: Cigarettes  . Smokeless tobacco: Never Used  Substance and Sexual Activity  . Alcohol use: Not Currently  . Drug use: No  . Sexual activity: Yes    Birth control/protection: None  Other Topics Concern  . Not on file  Social History Narrative  . Not on file   Social Determinants of Health   Financial Resource Strain:   . Difficulty of Paying Living Expenses: Not on file  Food Insecurity:   . Worried About Charity fundraiser  in the Last Year: Not on file  . Ran Out of Food in the Last Year: Not on file  Transportation Needs:   . Lack of Transportation (Medical): Not on file  . Lack of Transportation (Non-Medical): Not on file  Physical Activity: Insufficiently Active  . Days of Exercise per Week: 3 days  . Minutes of Exercise per Session: 30 min  Stress:   . Feeling of Stress : Not on file  Social Connections:   . Frequency of Communication with Friends and Family: Not on file  . Frequency of Social Gatherings with Friends and Family: Not on file  . Attends Religious Services: Not on file  . Active Member of Clubs or Organizations: Not on file  . Attends Banker Meetings: Not on file  . Marital  Status: Not on file   Additional Social History:                         Sleep: Good  Appetite:  Good  Current Medications: Current Facility-Administered Medications  Medication Dose Route Frequency Provider Last Rate Last Admin  . acetaminophen (TYLENOL) tablet 650 mg  650 mg Oral Q6H PRN Cristofano, Worthy Rancher, MD      . alum & mag hydroxide-simeth (MAALOX/MYLANTA) 200-200-20 MG/5ML suspension 30 mL  30 mL Oral Q4H PRN Cristofano, Paul A, MD      . influenza vac split quadrivalent PF (FLUARIX) injection 0.5 mL  0.5 mL Intramuscular Tomorrow-1000 Clapacs, John T, MD      . magnesium hydroxide (MILK OF MAGNESIA) suspension 30 mL  30 mL Oral Daily PRN Cristofano, Worthy Rancher, MD      . Melatonin TABS 5 mg  5 mg Oral QHS Cristofano, Worthy Rancher, MD   5 mg at 12/17/18 2122  . nicotine (NICODERM CQ - dosed in mg/24 hours) patch 21 mg  21 mg Transdermal Daily Clapacs, Jackquline Denmark, MD   21 mg at 12/18/18 0805  . risperiDONE (RISPERDAL) tablet 2 mg  2 mg Oral QHS Clapacs, Jackquline Denmark, MD   2 mg at 12/17/18 2122  . temazepam (RESTORIL) capsule 30 mg  30 mg Oral QHS PRN Cristofano, Worthy Rancher, MD        Lab Results: No results found for this or any previous visit (from the past 48 hour(s)).  Blood Alcohol level:  Lab Results  Component Value Date   ETH <10 12/15/2018   ETH <10 11/06/2018    Metabolic Disorder Labs: Lab Results  Component Value Date   HGBA1C 5.3 07/29/2018   MPG 105.41 07/29/2018   Lab Results  Component Value Date   PROLACTIN 107.9 (H) 07/29/2018   Lab Results  Component Value Date   CHOL 155 12/16/2018   TRIG 76 12/16/2018   HDL 36 (L) 12/16/2018   CHOLHDL 4.3 12/16/2018   VLDL 15 12/16/2018   LDLCALC 104 (H) 12/16/2018   LDLCALC 123 (H) 07/29/2018    Physical Findings: AIMS:  , ,  ,  ,    CIWA:    COWS:     Musculoskeletal: Strength & Muscle Tone: within normal limits Gait & Station: normal Patient leans: N/A  Psychiatric Specialty Exam: Physical Exam  Nursing  note and vitals reviewed. Constitutional: She is oriented to person, place, and time. She appears well-developed and well-nourished.  Cardiovascular: Normal rate.  Respiratory: Effort normal.  Musculoskeletal:        General: Normal range of motion.  Neurological: She is alert  and oriented to person, place, and time.  Skin: Skin is warm.    Review of Systems  Constitutional: Negative.   HENT: Negative.   Eyes: Negative.   Respiratory: Negative.   Cardiovascular: Negative.   Gastrointestinal: Negative.   Genitourinary: Negative.   Musculoskeletal: Negative.   Skin: Negative.   Neurological: Negative.   Psychiatric/Behavioral: Negative.     Blood pressure 108/64, pulse 98, temperature 98.1 F (36.7 C), temperature source Oral, resp. rate 17, height 6\' 1"  (1.854 m), weight 91.2 kg, last menstrual period 12/14/2018, SpO2 97 %.Body mass index is 26.52 kg/m.  General Appearance: Casual  Eye Contact:  Good  Speech:  Clear and Coherent and Normal Rate  Volume:  Normal  Mood:  Euthymic  Affect:  Congruent  Thought Process:  Coherent and Descriptions of Associations: Intact  Orientation:  Full (Time, Place, and Person)  Thought Content:  WDL  Suicidal Thoughts:  No  Homicidal Thoughts:  No  Memory:  Immediate;   Good Recent;   Good Remote;   Good  Judgement:  Fair  Insight:  Lacking  Psychomotor Activity:  Normal  Concentration:  Concentration: Good  Recall:  Good  Fund of Knowledge:  Fair  Language:  Good  Akathisia:  No  Handed:  Right  AIMS (if indicated):     Assets:  Communication Skills Desire for Improvement Housing Resilience Social Support  ADL's:  Intact  Cognition:  WNL  Sleep:  Number of Hours: 6.75   Assessment: Patient presents sitting group that she is attending.  Patient is pleasant, calm, cooperative.  Patient is very pleasant and appears to be well-groomed.  Patient has been attending other groups as well.  Patient has denied having any suicidal or  homicidal ideations and she denies having any hallucinations.  Patient seems to show some significant improvement today as she does have some logical thought process as well as a well-versed conversation with me today.  Patient does not appear to be responding to any internal stimuli.  Patient stated that she would continue her medications because she feels that though it helps her sleep extremely well.  However, patient does show little insight into her mental health disorder and feels that the medications that she is taking is only for sleep.  Attempted to give the patient about her medications for her mental illness and the patient's continues stating that the medication is only for sleep.  No changes will be made to her plan today  Treatment Plan Summary: Daily contact with patient to assess and evaluate symptoms and progress in treatment and Medication management Continue Risperdal 2 mg p.o. nightly for bipolar 1 disorder Continue Restoril 30 mg p.o. nightly as needed for insomnia Continue melatonin 5 mg p.o. nightly for sleep assistance Encourage group therapy participation Continue every 15 minute safety checks  Maryfrances Bunnellravis B [place] Nordquist, FNP 12/18/2018, 2:18 PM

## 2018-12-18 NOTE — Progress Notes (Signed)
D: Patient has been quiet, pleasant and cooperative. Forwards very little in conversation. Out in the dayroom. Denies SI, HI and AVH A: Continue to monitor for safety R: Safety maintained.

## 2018-12-18 NOTE — Progress Notes (Signed)
Pt went to group today. When she wasn't in groups or eating in day rooms she was isolative to her room. She has been cooperative and calm. Collier Bullock RN

## 2018-12-19 NOTE — BHH Counselor (Signed)
CSW was presented with NP Darnelle Maffucci to contact the patient's father.    In the conversation father divulged that patient has not talked to him since being admitted.  He also reported that he has concerns of pt being compliant with medication and following through with treatment recommendations.  He also reports concerns for patient's goal to "be natural" and taking natural medications.  NP Darnelle Maffucci reviewed medications with the father.  Father reports a desire to speak with the patient to talk about what plans would best support the patient and his role in supporting her.  Assunta Curtis, MSW, LCSW 12/19/2018 9:50 AM

## 2018-12-19 NOTE — Progress Notes (Signed)
[facility] MD Progress Note  12/19/2018 3:25 PM Laurie Hooper  MRN:  [id number]   Subjective: Follow-up for patient with bipolar 1 disorder.  Patient reports that she is doing very well today.  She denies having any suicidal or homicidal ideations and denies any hallucinations.  Patient says that she feels much more calmer and she has been doing extremely well and like to discharge.  She states that her plan is to return to her father but then states that she has not spoken to her father since she has been here.  She is encouraged multiple times to speak to her father.  Patient reports that she has been eating and sleeping well and denies having any medication side effects.  She does report that she likes medication because it does help her sleep at night.  Principal Problem: Bipolar 1 disorder (HCC) Diagnosis: Principal Problem:   Bipolar 1 disorder (HCC)  Total Time spent with patient: 30 minutes  Past Psychiatric History: Patient had a previous psychiatric hospitalization for several days in July of this year.  She was diagnosed with bipolar disorder and discharged on a combination of Tegretol and Risperdal.  Patient says she did follow-up with [facility] but in the last month has tapered herself off the medicine and feels better for it because she feels like she can think more clearly.  Denies any history of suicide attempts.  Had seen a counselor and had some psychiatric treatment with a diagnosis of bipolar prior to her hospitalization.  Past Medical History:  Past Medical History:  Diagnosis Date  . Anxiety   . Medical history non-contributory   . Psychosis [facility])     Past Surgical History:  Procedure Laterality Date  . KNEE ARTHROSCOPY W/ ACL RECONSTRUCTION     Family History:  Family History  Problem Relation Age of Onset  . Diabetes Paternal Grandmother   . Kidney disease Father   . Thyroid disease Mother   . Cancer Neg Hx    Family Psychiatric  History: Patient seems to have  only a vague idea of this.  She says she thinks her grandfather has mental health problems but she cannot specify in what way. Social History:  Social History   Substance and Sexual Activity  Alcohol Use Not Currently     Social History   Substance and Sexual Activity  Drug Use No    Social History   Socioeconomic History  . Marital status: Single    Spouse name: Not on file  . Number of children: Not on file  . Years of education: Not on file  . Highest education level: Not on file  Occupational History  . Not on file  Tobacco Use  . Smoking status: Former Smoker    Types: Cigarettes  . Smokeless tobacco: Never Used  Substance and Sexual Activity  . Alcohol use: Not Currently  . Drug use: No  . Sexual activity: Yes    Birth control/protection: None  Other Topics Concern  . Not on file  Social History Narrative  . Not on file   Social Determinants of Health   Financial Resource Strain:   . Difficulty of Paying Living Expenses: Not on file  Food Insecurity:   . Worried About Programme researcher, broadcasting/film/video in the Last Year: Not on file  . Ran Out of Food in the Last Year: Not on file  Transportation Needs:   . Lack of Transportation (Medical): Not on file  . Lack of Transportation (Non-Medical): Not  on file  Physical Activity:   . Days of Exercise per Week: Not on file  . Minutes of Exercise per Session: Not on file  Stress:   . Feeling of Stress : Not on file  Social Connections:   . Frequency of Communication with Friends and Family: Not on file  . Frequency of Social Gatherings with Friends and Family: Not on file  . Attends Religious Services: Not on file  . Active Member of Clubs or Organizations: Not on file  . Attends Archivist Meetings: Not on file  . Marital Status: Not on file   Additional Social History:                         Sleep: Good  Appetite:  Good  Current Medications: Current Facility-Administered Medications  Medication  Dose Route Frequency Provider Last Rate Last Admin  . acetaminophen (TYLENOL) tablet 650 mg  650 mg Oral Q6H PRN Cristofano, Dorene Ar, MD      . alum & mag hydroxide-simeth (MAALOX/MYLANTA) 200-200-20 MG/5ML suspension 30 mL  30 mL Oral Q4H PRN Cristofano, Paul A, MD      . influenza vac split quadrivalent PF (FLUARIX) injection 0.5 mL  0.5 mL Intramuscular Tomorrow-1000 Clapacs, John T, MD      . magnesium hydroxide (MILK OF MAGNESIA) suspension 30 mL  30 mL Oral Daily PRN Cristofano, Dorene Ar, MD      . Melatonin TABS 5 mg  5 mg Oral QHS Cristofano, Paul A, MD   5 mg at 12/18/18 2127  . nicotine (NICODERM CQ - dosed in mg/24 hours) patch 21 mg  21 mg Transdermal Daily Clapacs, Madie Reno, MD   21 mg at 12/19/18 0814  . risperiDONE (RISPERDAL) tablet 2 mg  2 mg Oral QHS Clapacs, Madie Reno, MD   2 mg at 12/18/18 2127  . temazepam (RESTORIL) capsule 30 mg  30 mg Oral QHS PRN Cristofano, Dorene Ar, MD        Lab Results: No results found for this or any previous visit (from the past 48 hour(s)).  Blood Alcohol level:  Lab Results  Component Value Date   ETH <10 12/15/2018   ETH <10 44/03/4740    Metabolic Disorder Labs: Lab Results  Component Value Date   HGBA1C 5.3 07/29/2018   MPG 105.41 07/29/2018   Lab Results  Component Value Date   PROLACTIN 107.9 (H) 07/29/2018   Lab Results  Component Value Date   CHOL 155 12/16/2018   TRIG 76 12/16/2018   HDL 36 (L) 12/16/2018   CHOLHDL 4.3 12/16/2018   VLDL 15 12/16/2018   LDLCALC 104 (H) 12/16/2018   LDLCALC 123 (H) 07/29/2018    Physical Findings: AIMS:  , ,  ,  ,    CIWA:    COWS:     Musculoskeletal: Strength & Muscle Tone: within normal limits Gait & Station: normal Patient leans: N/A  Psychiatric Specialty Exam: Physical Exam  Nursing note and vitals reviewed. Constitutional: She is oriented to person, place, and time. She appears well-developed and well-nourished.  Cardiovascular: Normal rate.  Respiratory: Effort normal.   Musculoskeletal:        General: Normal range of motion.  Neurological: She is alert and oriented to person, place, and time.  Skin: Skin is warm.    Review of Systems  Constitutional: Negative.   HENT: Negative.   Eyes: Negative.   Respiratory: Negative.   Cardiovascular: Negative.  Gastrointestinal: Negative.   Genitourinary: Negative.   Musculoskeletal: Negative.   Skin: Negative.   Neurological: Negative.   Psychiatric/Behavioral: The patient is nervous/anxious.     Blood pressure 111/67, pulse 98, temperature 98.5 F (36.9 C), temperature source Oral, resp. rate 18, height 6\' 1"  (1.854 m), weight 91.2 kg, last menstrual period 12/14/2018, SpO2 99 %.Body mass index is 26.52 kg/m.  General Appearance: Casual  Eye Contact:  Good  Speech:  Clear and Coherent and Normal Rate  Volume:  Decreased  Mood:  Anxious  Affect:  Congruent and Flat  Thought Process:  Coherent and Descriptions of Associations: Intact  Orientation:  Full (Time, Place, and Person)  Thought Content:  WDL  Suicidal Thoughts:  No  Homicidal Thoughts:  No  Memory:  Immediate;   Fair Recent;   Fair Remote;   Fair  Judgement:  Fair  Insight:  Lacking  Psychomotor Activity:  Normal  Concentration:  Concentration: Fair  Recall:  14/06/2018 of Knowledge:  Fair  Language:  Fair  Akathisia:  No  Handed:  Right  AIMS (if indicated):     Assets:  Desire for Improvement Physical Health Resilience Social Support  ADL's:  Intact  Cognition:  WNL  Sleep:  Number of Hours: 6.75   Assessment: Patient presents in her room lying in the bed but is awake.  Patient is pleasant, calm, cooperative at the time of the first interview.  Patient has good eye contact incongruent affect at that time.  Patient has been showing significant improvement and the plan was to ensure that she had safe discharge to her father's house.  CSW when I contact the patient's father and he states that she cannot return there if she is not  willing to follow instructions and directions to take medications and he wants her to be on a long-acting injectable.  This was discussed with the patient and she states that she will contact her father.  And that she would prefer to start the injection after she is discharged.  After multiple attempts of getting her to call her father who was discovered that the patient has been lying to [employer] at a minimum of 3 times about calling her father and then when her father was contacted he would say that he has not heard from her.  The patient then started saying that the CSW was changing the story of what her father said and that she spoke to him and her mother and she can return home with either one.  Father was contacted again and he states that he still has not heard from her.  Again he confirms that she cannot return to his house and that he will try to help her with another place to go.  I attempted to encourage patient to go ahead and take the [place] injection while she was here and continue with her outpatient and patient continues to divert from the conversation and makes comments such as she will contact her father again but never does.  Patient's mood and attitude did change after several confrontations about her father and to my last known knowledge and the patient still has not contacted her father  Treatment Plan Summary: Daily contact with patient to assess and evaluate symptoms and progress in treatment and Medication management Continue melatonin 5 mg p.o. nightly for insomnia Continue Risperdal 2 mg p.o. nightly for bipolar 1 disorder Continue Restoril 30 mg p.o. nightly as needed for insomnia Continue NicoDerm CQ 21 mg  transdermal patch for smoking cessation Encourage group therapy participation Continue every 15 minute safety checks Continued encouragement to take the [place] injection CSW is continually working on assisting patient with placement after discharge.  Gerlene Burdockravis B  Quantay Zaremba, FNP 12/19/2018, 3:25 PM

## 2018-12-19 NOTE — BHH Counselor (Signed)
CSW attempted to call the patient's father to touch bas on patient's discharge. CSW was unable to speak with the patient's father and left a HIPAA compliant voicemail.  Assunta Curtis, MSW, LCSW 12/19/2018 8:47 AM

## 2018-12-19 NOTE — Plan of Care (Signed)
Has been calm and cooperative on the unit. Expressing readiness for discharge but not specific destination.

## 2018-12-19 NOTE — BHH Counselor (Signed)
CSw followed up with patient to inform of conversation with father.  CSW reported that per father pt is not able to return their.  CSW encouraged pt to think of other family she may stay with and/or CSW can provide with shelter resources.    CSW followed up on pt's housing plans as her father has said that she can NOT go there.  Pt reports that she is going to her mothers.  CSW pointed out that per conversation with the pts father the mother was out of town.  CSW also pointed out that it may be requested that treatment team speak with mother to confirm.    Pt became upset stating that she wanted to speak with the doctor because this was "personal".  CSW encouraged pt to speak with psychiatrist to address her concerns.  Assunta Curtis, MSW, LCSW 12/19/2018 12:32 PM

## 2018-12-19 NOTE — BHH Counselor (Signed)
CSW called father to assess how the conversation went, father reports that the patient has not called him.   Father reports that pt can NOT come to his home because he "sees no change".  He reports that his other children are afraid and he "is not dealing" with patient's defiance.  He again expressed concern that patient is not willing to discuss her treatment of aftercare plans.  Assunta Curtis, MSW, LCSW 12/19/2018 12:02 PM

## 2018-12-19 NOTE — BHH Counselor (Signed)
CSW helped to facilitate and phone call between father and patient.  Patient had previously reported speaking to the father, however, when CSW followed up with father he denied this.   Father expressed to pt a desire for her to take medications as prescribed and follow up with appropriate aftercare.  Father inquired about who patient has contacted in terms of possible housing resources.  Patient identified that she has spoke with her mother, however, the mother is out of town per the father. Father informed pt that she burned bridges with her family and that his other children in the home are afraid of pt.  Father informed pt that legally she can return to the grandmother's home as patient has been receiving mail there.  Pt stated that she was uncomfortable doing so.   Patient began to state to every question of the father that she wanted to talk to him privately "and not with other people present and in my business". CSW and father both tried to redirect patient that this is done in an effort to develop concrete plans to support pt, however, she continued to decline.   CSW developed plan for pt to call father at a later time and father to follow up with this CSW afterwards.  Assunta Curtis, MSW, LCSW 12/19/2018 12:00 PM

## 2018-12-19 NOTE — Progress Notes (Signed)
Recreation Therapy Notes   Date: 12/19/2018  Time: 9:30 am  Location: Craft room  Behavioral response: Appropriate  Intervention Topic: Relaxation    Discussion/Intervention:  Group content today was focused on relaxation. The group defined relaxation and identified healthy ways to relax. Individuals expressed how much time they spend relaxing. Patients expressed how much their life would be if they did not make time for themselves to relax. The group stated ways they could improve their relaxation techniques in the future.  Individuals participated in the intervention "Time to Relax" where they had a chance to experience different relaxation techniques.  Clinical Observations/Feedback:  Patient came to group and defined relaxation as doing yoga. She was pulled from group by Education officer, museum and never returned.  Francella Barnett LRT/CTRS           Lynsie Mcwatters 12/19/2018 11:09 AM

## 2018-12-19 NOTE — [facility] Group Notes (Signed)
LCSW Group Therapy Note  12/19/2018 12:50 PM  Type of Therapy and Topic:  Group Therapy:  Feelings around Relapse and Recovery  Participation Level:  None   Description of Group:    Patients in this group will discuss emotions they experience before and after a relapse. They will process how experiencing these feelings, or avoidance of experiencing them, relates to having a relapse. Facilitator will guide patients to explore emotions they have related to recovery. Patients will be encouraged to process which emotions are more powerful. They will be guided to discuss the emotional reaction significant others in their lives may have to their relapse or recovery. Patients will be assisted in exploring ways to respond to the emotions of others without this contributing to a relapse.  Therapeutic Goals: 1. Patient will identify two or more emotions that lead to a relapse for them 2. Patient will identify two emotions that result when they relapse 3. Patient will identify two emotions related to recovery 4. Patient will demonstrate ability to communicate their needs through discussion and/or role plays   Summary of Patient Progress: Patient was present in group.  Patient arrived late and read a magazine in group.  Patient did not participate in group discussions.   Therapeutic Modalities:   Cognitive Behavioral Therapy Solution-Focused Therapy Assertiveness Training Relapse Prevention Therapy   Assunta Curtis, MSW, LCSW 12/19/2018 12:50 PM

## 2018-12-19 NOTE — Progress Notes (Signed)
   12/19/18 1944  Psych Admission Type (Psych Patients Only)  Admission Status Involuntary  Psychosocial Assessment  Patient Complaints None  Eye Contact Fair  Facial Expression Flat  Affect Blunted  Speech Logical/coherent  Interaction Cautious  Motor Activity Other (Comment) (WNL)  Appearance/Hygiene Unremarkable  Behavior Characteristics Cooperative;Anxious  Mood Pleasant  Aggressive Behavior  Effect No apparent injury  Thought Process  Coherency WDL  Content WDL  Delusions None reported or observed  Perception WDL  Hallucination None reported or observed  Judgment WDL  Confusion WDL  Danger to Self  Current suicidal ideation? Denies  Danger to Others  Danger to Others None reported or observed   Pt is calm and pleasant. Denies SI, HI and AVH. Pt is ready for discharge she says. Eye contact is fair and affect is blunted. Pt attends groups but isolates in room at other times.

## 2018-12-19 NOTE — [facility] Counselor (Signed)
CSW called pt's father with CSW Minette Brine present.    CSW updated father that CSW informed pt that per father the patient can not return to his home. CSW also informed father that at this time the patient is reporting that she may go to her mothers home. Father reiterated that mother is out of town but he would call her again once mother is stationary to update her on this plan.  Father confirmed that patient can not go to his home.   CSW asked if pt and father had spoke and father reports that the pt has not. Father reports that he wants to visit the pt.  CSW pointed out that the patient has not completed the visitor form, to her knowledge.  Father expressed frustration that pt has not called him or completed the form.   Assunta Curtis, MSW, LCSW 12/19/2018 2:33 PM

## 2018-12-20 LAB — RESPIRATORY PANEL BY RT PCR (FLU A&B, COVID)
Influenza A by PCR: NEGATIVE
Influenza B by PCR: NEGATIVE
SARS Coronavirus 2 by RT PCR: NEGATIVE

## 2018-12-20 NOTE — [facility] Group Notes (Signed)
Type of Therapy and Topic:  Group Therapy:  Trust and Honesty 12/20/2018 1:30pm Participation Level:  Did Not Attend   Description of Group:     In this group patients will be asked to explore the value of being honest.  Patients will be guided to discuss their thoughts, feelings, and behaviors related to honesty and trusting in others. Patients will process together how trust and honesty relate to forming relationships with peers, family members, and self. Each patient will be challenged to identify and express feelings of being vulnerable. Patients will discuss reasons why people are dishonest and identify alternative outcomes if one was truthful (to self or others). This group will be process-oriented, with patients participating in exploration of their own experiences, giving and receiving support, and processing challenge from other group members.    Therapeutic Goals:  1.  Patient will identify why honesty is important to relationships and how honesty overall affects relationships.  2.  Patient will identify a situation where they lied or were lied too and the  feelings, thought process, and behaviors surrounding the situation  3.  Patient will identify the meaning of being vulnerable, how that feels, and how that correlates to being honest with self and others.  4.  Patient will identify situations where they could have told the truth, but instead lied and explain reasons of dishonesty.    Summary of Patient Progress:  Announcement made via overhead page, patient did not attend.   Therapeutic Modalities:   Cognitive Behavioral Therapy Solution Focused Therapy Motivational Interviewing Brief Therapy 

## 2018-12-20 NOTE — Plan of Care (Signed)
D: Patient denies SI/HI/AVH. Patient verbally contracts for safety. Patient is calm, cooperative and pleasant. Patient is seen in milieu interacting with peers. Patient has no complaints at this time. Patient is in milieu for meals and is seen interacting with peers.   A: Patient was assessed by this nurse. Patient was oriented to unit. Patient's safety was maintained on unit. Q x 15 minute observation checks were completed for safety. Patient care plan was reviewed. Patient was offered support and encouragement. Patient was encourage to attend groups, participate in unit activities and continue with plan of care.    R: Patient has no complaints of pain at this time. Patient is receptive to treatment and safety maintained on unit.     Problem: Education: Goal: Knowledge of  General Education information/materials will improve Outcome: Progressing Goal: Emotional status will improve Outcome: Progressing Goal: Verbalization of understanding the information provided will improve Outcome: Progressing

## 2018-12-20 NOTE — Tx Team (Signed)
Interdisciplinary Treatment and Diagnostic Plan Update  12/20/2018 Time of Session: 9:00AM Laurie Hooper MRN: [id number]  Principal Diagnosis: Bipolar 1 disorder [facility])  Secondary Diagnoses: Principal Problem:   Bipolar 1 disorder ([place])   Current Medications:  Current Facility-Administered Medications  Medication Dose Route Frequency Provider Last Rate Last Admin  . acetaminophen (TYLENOL) tablet 650 mg  650 mg Oral Q6H PRN Cristofano, Dorene Ar, MD      . alum & mag hydroxide-simeth (MAALOX/MYLANTA) 200-200-20 MG/5ML suspension 30 mL  30 mL Oral Q4H PRN Cristofano, Paul A, MD      . influenza vac split quadrivalent PF (FLUARIX) injection 0.5 mL  0.5 mL Intramuscular Tomorrow-1000 Clapacs, John T, MD      . magnesium hydroxide (MILK OF MAGNESIA) suspension 30 mL  30 mL Oral Daily PRN Cristofano, Dorene Ar, MD      . Melatonin TABS 5 mg  5 mg Oral QHS Cristofano, Dorene Ar, MD   5 mg at 12/19/18 2139  . nicotine (NICODERM CQ - dosed in mg/24 hours) patch 21 mg  21 mg Transdermal Daily Clapacs, Madie Reno, MD   21 mg at 12/20/18 0835  . risperiDONE (RISPERDAL) tablet 2 mg  2 mg Oral QHS Clapacs, Madie Reno, MD   2 mg at 12/19/18 2138  . temazepam (RESTORIL) capsule 30 mg  30 mg Oral QHS PRN Cristofano, Dorene Ar, MD       PTA Medications: Medications Prior to Admission  Medication Sig Dispense Refill Last Dose  . benztropine (COGENTIN) 0.5 MG tablet Take 1 tablet (0.5 mg total) by mouth 2 (two) times daily. (Patient not taking: Reported on 12/15/2018) 60 tablet 2   . carbamazepine (TEGRETOL) 100 MG chewable tablet Chew 2 tablets (200 mg total) by mouth 2 (two) times a day. (Patient not taking: Reported on 12/15/2018) 120 tablet 1   . risperiDONE (RISPERDAL) 4 MG tablet Take 1 tablet (4 mg total) by mouth at bedtime. (Patient not taking: Reported on 12/15/2018) 90 tablet 1   . temazepam (RESTORIL) 30 MG capsule Take 1 capsule (30 mg total) by mouth at bedtime as needed for sleep. (Patient not taking:  Reported on 12/15/2018) 30 capsule 1   . VITAMIN D, CHOLECALCIFEROL, PO Take 100 mg by mouth daily.       Patient Stressors: Financial difficulties Marital or family conflict Medication change or noncompliance  Patient Strengths: Average or above average intelligence Capable of independent living Communication skills Supportive family/friends  Treatment Modalities: Medication Management, Group therapy, Case management,  1 to 1 session with clinician, Psychoeducation, Recreational therapy.   Physician Treatment Plan for Primary Diagnosis: Bipolar 1 disorder ([place]) Long Term Goal(s): Improvement in symptoms so as ready for discharge Improvement in symptoms so as ready for discharge   Short Term Goals: Ability to verbalize feelings will improve Ability to demonstrate self-control will improve Ability to identify and develop effective coping behaviors will improve Compliance with prescribed medications will improve  Medication Management: Evaluate patient's response, side effects, and tolerance of medication regimen.  Therapeutic Interventions: 1 to 1 sessions, Unit Group sessions and Medication administration.  Evaluation of Outcomes: Not Met  Physician Treatment Plan for Secondary Diagnosis: Principal Problem:   Bipolar 1 disorder ([place])  Long Term Goal(s): Improvement in symptoms so as ready for discharge Improvement in symptoms so as ready for discharge   Short Term Goals: Ability to verbalize feelings will improve Ability to demonstrate self-control will improve Ability to identify and develop effective coping behaviors will improve  Compliance with prescribed medications will improve     Medication Management: Evaluate patient's response, side effects, and tolerance of medication regimen.  Therapeutic Interventions: 1 to 1 sessions, Unit Group sessions and Medication administration.  Evaluation of Outcomes: Not Progressing   RN Treatment Plan for Primary Diagnosis:  Bipolar 1 disorder ([place]) Long Term Goal(s): Knowledge of disease and therapeutic regimen to maintain health will improve  Short Term Goals: Ability to demonstrate self-control, Ability to participate in decision making will improve, Ability to verbalize feelings will improve, Ability to disclose and discuss suicidal ideas and Ability to identify and develop effective coping behaviors will improve  Medication Management: RN will administer medications as ordered by provider, will assess and evaluate patient's response and provide education to patient for prescribed medication. RN will report any adverse and/or side effects to prescribing provider.  Therapeutic Interventions: 1 on 1 counseling sessions, Psychoeducation, Medication administration, Evaluate responses to treatment, Monitor vital signs and CBGs as ordered, Perform/monitor CIWA, COWS, AIMS and Fall Risk screenings as ordered, Perform wound care treatments as ordered.  Evaluation of Outcomes: Not Progressing   LCSW Treatment Plan for Primary Diagnosis: Bipolar 1 disorder ([place]) Long Term Goal(s): Safe transition to appropriate next level of care at discharge, Engage patient in therapeutic group addressing interpersonal concerns.  Short Term Goals: Engage patient in aftercare planning with referrals and resources, Increase social support, Increase ability to appropriately verbalize feelings, Increase emotional regulation and Facilitate acceptance of mental health diagnosis and concerns  Therapeutic Interventions: Assess for all discharge needs, 1 to 1 time with Social worker, Explore available resources and support systems, Assess for adequacy in community support network, Educate family and significant other(s) on suicide prevention, Complete Psychosocial Assessment, Interpersonal group therapy.  Evaluation of Outcomes: Not Progressing   Progress in Treatment: Attending groups: Yes. Participating in groups: Yes. Taking medication as  prescribed: Yes. Toleration medication: Yes. Family/Significant other contact made: Yes, individual(s) contacted:  father. Patient understands diagnosis: Yes. Discussing patient identified problems/goals with staff: Yes. Medical problems stabilized or resolved: Yes. Denies suicidal/homicidal ideation: Yes. Issues/concerns per patient self-inventory: No. Other: none  New problem(s) identified: No, Describe:  none  New Short Term/Long Term Goal(s): elimination of symptoms of psychosis, medication management for mood stabilization; elimination of SI thoughts; development of comprehensive mental wellness plan.  Patient Goals:  "to work on my patience"  Discharge Plan or Barriers: Patient reports plans to return to her fathers home at discharge.  Patient declined for CSW to assist with scheduling aftercare appointments at this time, stating that she is an adult.  Pt did report plans to continue with [facility] and "get the once a month Schizophrenia shot".   Update 12/12- Patient is unable to return to her father's home. CSW team following to assist in discharge planning. Patient continues to decline outpatient referrals.  Reason for Continuation of Hospitalization: Anxiety Depression Hallucinations Medication stabilization  Estimated Length of Stay: 1-7 days  Recreational Therapy: Patient: N/A Patient Goal: Patient will engage in groups without prompting or encouragement from LRT x3 group sessions within 5 recreation therapy group sessions  Attendees: Patient: Laurie Hooper 12/20/2018 9:30 AM  Physician: Dr. Weber Cooks, MD [place] 12/20/2018 9:30 AM  Nursing: Lyda Kalata, RN 12/20/2018 9:30 AM  RN Care Manager: 12/20/2018 9:30 AM  Social Worker: Assunta Curtis, LCSW 12/20/2018 9:30 AM  Recreational Therapist: Roanna Epley, CTRS, LRT 12/20/2018 9:30 AM  Other: Evalina Field, LCSW 12/20/2018 9:30 AM  Other: Stephanie Acre, [place] 12/20/2018 9:30 AM  Other: 12/20/2018 9:30  AM     Scribe for Treatment Team: Joellen Jersey, Latanya Presser 12/20/2018 9:30 AM

## 2018-12-20 NOTE — Progress Notes (Signed)
[facility]  12/20/2018 10:31 AM Laurie Hooper  MRN:  [id number] Subjective: Patient is a 28 year old female with a past psychiatric history significant for bipolar disorder who was admitted to the [facility] on 12/7 after her family had placed her under involuntary commitment.  Reportedly she was moving around staying at various relatives houses and going from one place to another.  She was apparently knocking on multiple homes in the middle the night.  She did admit to auditory hallucinations on admission.  Objective: Patient is a 28 year old female with the above-stated past psychiatric history who is seen in follow-up.  When I knocked on her door to examine her this morning.  There was water all over the floor, and there was water all over the bathroom floor.  It did look like she had attempted to float her bathroom, but her comment was "I was just taking a shower".  Review of her notes from yesterday stated that it was felt she was much more calmer, and was doing well and would like to discharge.  She denied auditory or visual hallucinations.  She denied any symptoms this morning.  No evidence of racing thoughts or pressured speech.  Her vital signs are stable, she is afebrile.  She slept 7.15 hours last night.  Review of her laboratories on admission revealed a mildly elevated glucose at 125, normal CBC with differential, essentially normal lipid panel, negative drug screen, negative pregnancy test, blood alcohol was less than 10.  She continues on Risperdal 2 mg p.o. nightly and melatonin.  She also receives temazepam 30 mg p.o. nightly as needed.  Principal Problem: Bipolar 1 disorder (HCC) Diagnosis: Principal Problem:   Bipolar 1 disorder (HCC)  Total Time spent with patient: 20 minutes  Past Psychiatric History: See admission H&P  Past Medical History:  Past Medical History:  Diagnosis Date  . Anxiety   . Medical history non-contributory   .  Psychosis [facility](HCC)     Past Surgical History:  Procedure Laterality Date  . KNEE ARTHROSCOPY W/ ACL RECONSTRUCTION     Family History:  Family History  Problem Relation Age of Onset  . Diabetes Paternal Grandmother   . Kidney disease Father   . Thyroid disease Mother   . Cancer Neg Hx    Family Psychiatric  History: See admission H&P Social History:  Social History   Substance and Sexual Activity  Alcohol Use Not Currently     Social History   Substance and Sexual Activity  Drug Use No    Social History   Socioeconomic History  . Marital status: Single    Spouse name: Not on file  . Number of children: Not on file  . Years of education: Not on file  . Highest education level: Not on file  Occupational History  . Not on file  Tobacco Use  . Smoking status: Former Smoker    Types: Cigarettes  . Smokeless tobacco: Never Used  Substance and Sexual Activity  . Alcohol use: Not Currently  . Drug use: No  . Sexual activity: Yes    Birth control/protection: None  Other Topics Concern  . Not on file  Social History Narrative  . Not on file   Social Determinants of Health   Financial Resource Strain:   . Difficulty of Paying Living Expenses: Not on file  Food Insecurity:   . Worried About Programme researcher, broadcasting/film/videounning Out of Food in the Last Year: Not on file  . Ran Out of  Food in the Last Year: Not on file  Transportation Needs:   . Lack of Transportation (Medical): Not on file  . Lack of Transportation (Non-Medical): Not on file  Physical Activity:   . Days of Exercise per Week: Not on file  . Minutes of Exercise per Session: Not on file  Stress:   . Feeling of Stress : Not on file  Social Connections:   . Frequency of Communication with Friends and Family: Not on file  . Frequency of Social Gatherings with Friends and Family: Not on file  . Attends Religious Services: Not on file  . Active Member of Clubs or Organizations: Not on file  . Attends Banker Meetings:  Not on file  . Marital Status: Not on file   Additional Social History:                         Sleep: Fair  Appetite:  Good  Current Medications: Current Facility-Administered Medications  Medication Dose Route Frequency Provider Last Rate Last Admin  . acetaminophen (TYLENOL) tablet 650 mg  650 mg Oral Q6H PRN Cristofano, Worthy Rancher, MD      . alum & mag hydroxide-simeth (MAALOX/MYLANTA) 200-200-20 MG/5ML suspension 30 mL  30 mL Oral Q4H PRN Cristofano, Paul A, MD      . influenza vac split quadrivalent PF (FLUARIX) injection 0.5 mL  0.5 mL Intramuscular Tomorrow-1000 Clapacs, John T, MD      . magnesium hydroxide (MILK OF MAGNESIA) suspension 30 mL  30 mL Oral Daily PRN Cristofano, Worthy Rancher, MD      . Melatonin TABS 5 mg  5 mg Oral QHS Cristofano, Worthy Rancher, MD   5 mg at 12/19/18 2139  . nicotine (NICODERM CQ - dosed in mg/24 hours) patch 21 mg  21 mg Transdermal Daily Clapacs, Jackquline Denmark, MD   21 mg at 12/20/18 0835  . risperiDONE (RISPERDAL) tablet 2 mg  2 mg Oral QHS Clapacs, Jackquline Denmark, MD   2 mg at 12/19/18 2138  . temazepam (RESTORIL) capsule 30 mg  30 mg Oral QHS PRN Cristofano, Worthy Rancher, MD        Lab Results: No results found for this or any previous visit (from the past 48 hour(s)).  Blood Alcohol level:  Lab Results  Component Value Date   ETH <10 12/15/2018   ETH <10 11/06/2018    Metabolic Disorder Labs: Lab Results  Component Value Date   HGBA1C 5.3 07/29/2018   MPG 105.41 07/29/2018   Lab Results  Component Value Date   PROLACTIN 107.9 (H) 07/29/2018   Lab Results  Component Value Date   CHOL 155 12/16/2018   TRIG 76 12/16/2018   HDL 36 (L) 12/16/2018   CHOLHDL 4.3 12/16/2018   VLDL 15 12/16/2018   LDLCALC 104 (H) 12/16/2018   LDLCALC 123 (H) 07/29/2018    Physical Findings: AIMS:  , ,  ,  ,    CIWA:    COWS:     Musculoskeletal: Strength & Muscle Tone: within normal limits Gait & Station: normal Patient leans: N/A  Psychiatric Specialty  Exam: Physical Exam  Nursing note and vitals reviewed. Constitutional: She is oriented to person, place, and time. She appears well-developed and well-nourished.  HENT:  Head: Normocephalic and atraumatic.  Respiratory: Effort normal.  Neurological: She is alert and oriented to person, place, and time.    Review of Systems  Blood pressure 95/60, pulse 92, temperature 98.4 F (  36.9 C), temperature source Oral, resp. rate 18, height 6\' 1"  (1.854 m), weight 91.2 kg, last menstrual period 12/14/2018, SpO2 99 %.Body mass index is 26.52 kg/m.  General Appearance: Casual  Eye Contact:  Fair  Speech:  Normal Rate  Volume:  Normal  Mood:  Dysphoric  Affect:  Congruent  Thought Process:  Coherent and Descriptions of Associations: Circumstantial  Orientation:  Full (Time, Place, and Person)  Thought Content:  Logical  Suicidal Thoughts:  No  Homicidal Thoughts:  No  Memory:  Immediate;   Fair Recent;   Fair Remote;   Fair  Judgement:  Intact  Insight:  Lacking  Psychomotor Activity:  Normal  Concentration:  Concentration: Fair and Attention Span: Fair  Recall:  [employer] of Knowledge:  Fair  Language:  Good  Akathisia:  Negative  Handed:  Right  AIMS (if indicated):     Assets:  Desire for Improvement Resilience  ADL's:  Intact  Cognition:  WNL  Sleep:  Number of Hours: 7.15     Treatment Plan Summary: Daily contact with patient to assess and evaluate symptoms and progress in treatment, Medication management and Plan : Patient is seen and examined.  Patient is a 28 year old female with the above-stated past psychiatric history who is seen in follow-up.   Diagnosis: #1 bipolar disorder  Patient is seen in follow-up.  She seems to be doing well with the Risperdal.  She denies knowing that she flooded the bathroom, and stated "I was just taking a shower".  She is not a great historian, but does not appear to be acutely psychotic or grossly manic at least at this point.  No change  in her medications, and hopefully she will continue to improve.  1.  Continue Risperdal 2 mg p.o. nightly for mood stability and psychosis. 2.  Continue melatonin 5 mg p.o. nightly for insomnia. 3.  Continue temazepam 30 mg p.o. nightly as needed insomnia. 4.  Disposition planning-in progress.  Sharma Covert, MD 12/20/2018, 10:31 AM

## 2018-12-21 MED ORDER — RISPERIDONE 2 MG PO TABS: 2.0000 mg | ORAL_TABLET | Freq: Every day | ORAL | 0 refills | Status: DC

## 2018-12-21 NOTE — Discharge Summary (Signed)
Physician Discharge Summary Note  Patient:  Laurie Hooper is an 28 y.o., female MRN:  [id number] DOB:  16-Aug-1990 Patient phone:  [phone] (home)  Patient address:   [place] [address],  Total Time spent with patient: 30 minutes  Date of Admission:  12/15/2018 Date of Discharge: 12/21/2018  Reason for Admission: Patient is a 28 year old female who was brought to the [facility] from the [facility] emergency department after involuntary commitment paperwork said been taken out by her family.  The involuntary commitment paperwork stated the patient had been acting erratically and potentially endangering her 49-year-old child.  Principal Problem: Bipolar 1 disorder [facility]) Discharge Diagnoses: Principal Problem:   Bipolar 1 disorder [facility])   Past Psychiatric History: See admission H&P  Past Medical History:  Past Medical History:  Diagnosis Date  . Anxiety   . Medical history non-contributory   . Psychosis [facility])     Past Surgical History:  Procedure Laterality Date  . KNEE ARTHROSCOPY W/ ACL RECONSTRUCTION     Family History:  Family History  Problem Relation Age of Onset  . Diabetes Paternal Grandmother   . Kidney disease Father   . Thyroid disease Mother   . Cancer Neg Hx    Family Psychiatric  History: See admission H&P Social History:  Social History   Substance and Sexual Activity  Alcohol Use Not Currently     Social History   Substance and Sexual Activity  Drug Use No    Social History   Socioeconomic History  . Marital status: Single    Spouse name: Not on file  . Number of children: Not on file  . Years of education: Not on file  . Highest education level: Not on file  Occupational History  . Not on file  Tobacco Use  . Smoking status: Former Smoker    Types: Cigarettes  . Smokeless tobacco: Never Used  Substance and Sexual Activity  . Alcohol use: Not Currently  . Drug use: No  . Sexual  activity: Yes    Birth control/protection: None  Other Topics Concern  . Not on file  Social History Narrative  . Not on file   Social Determinants of Health   Financial Resource Strain:   . Difficulty of Paying Living Expenses: Not on file  Food Insecurity:   . Worried About Charity fundraiser in the Last Year: Not on file  . Ran Out of Food in the Last Year: Not on file  Transportation Needs:   . Lack of Transportation (Medical): Not on file  . Lack of Transportation (Non-Medical): Not on file  Physical Activity:   . Days of Exercise per Week: Not on file  . Minutes of Exercise per Session: Not on file  Stress:   . Feeling of Stress : Not on file  Social Connections:   . Frequency of Communication with Friends and Family: Not on file  . Frequency of Social Gatherings with Friends and Family: Not on file  . Attends Religious Services: Not on file  . Active Member of Clubs or Organizations: Not on file  . Attends Archivist Meetings: Not on file  . Marital Status: Not on file    Hospital Course: Patient is seen and examined.  Patient is a 28 year old female with the above-stated past psychiatric history who was admitted on 12/15/2018.  On admission she admitted to auditory hallucinations and was somewhat dysphoric.  She had stated that she felt side  effects from Risperdal at its current dosage.  The Risperdal was restarted but at a lower dosage.  During the course of the hospitalization she improved and the medication dosage was adjusted.  On 12/21/2018 she denied auditory or visual hallucinations.  She denied any suicidal or homicidal ideation.  There is no gross evidence of any delusions.  It was decided she can be discharged to home.  At discharge her Tegretol had been stopped, and her Risperdal had been increased to 2 mg p.o. nightly.  Physical Findings: AIMS:  , ,  ,  ,    CIWA:    COWS:     Musculoskeletal: Strength & Muscle Tone: within normal limits Gait &  Station: normal Patient leans: N/A  Psychiatric Specialty Exam: Physical Exam  Nursing note and vitals reviewed. Constitutional: She is oriented to person, place, and time. She appears well-developed and well-nourished.  HENT:  Head: Normocephalic and atraumatic.  Respiratory: Effort normal.  Neurological: She is alert and oriented to person, place, and time.    Review of Systems  Blood pressure (!) 82/63, pulse 76, temperature 97.8 F (36.6 C), temperature source Oral, resp. rate 18, height 6\' 1"  (1.854 m), weight 91.2 kg, last menstrual period 12/14/2018, SpO2 99 %.Body mass index is 26.52 kg/m.  General Appearance: Casual  Eye Contact:  Fair  Speech:  Normal Rate  Volume:  Normal  Mood:  Anxious  Affect:  Congruent  Thought Process:  Coherent and Descriptions of Associations: Intact  Orientation:  Full (Time, Place, and Person)  Thought Content:  Logical  Suicidal Thoughts:  No  Homicidal Thoughts:  No  Memory:  Immediate;   Fair Recent;   Fair Remote;   Fair  Judgement:  Intact  Insight:  Good  Psychomotor Activity:  Normal  Concentration:  Concentration: Fair and Attention Span: Fair  Recall:  F[employer]Fair  Fund of Knowledge:  Fair  Language:  Fair  Akathisia:  Negative  Handed:  Right  AIMS (if indicated):     Assets:  Desire for Improvement Resilience  ADL's:  Intact  Cognition:  WNL  Sleep:  Number of Hours: 5     Have you used any form of tobacco in the last 30 days? (Cigarettes, Smokeless Tobacco, Cigars, and/or Pipes): Yes  Has this patient used any form of tobacco in the last 30 days? (Cigarettes, Smokeless Tobacco, Cigars, and/or Pipes) Yes, No  Blood Alcohol level:  Lab Results  Component Value Date   ETH <10 12/15/2018   ETH <10 11/06/2018    Metabolic Disorder Labs:  Lab Results  Component Value Date   HGBA1C 5.3 07/29/2018   MPG 105.41 07/29/2018   Lab Results  Component Value Date   PROLACTIN 107.9 (H) 07/29/2018   Lab Results  Component  Value Date   CHOL 155 12/16/2018   TRIG 76 12/16/2018   HDL 36 (L) 12/16/2018   CHOLHDL 4.3 12/16/2018   VLDL 15 12/16/2018   LDLCALC 104 (H) 12/16/2018   LDLCALC 123 (H) 07/29/2018    See Psychiatric Specialty Exam and Suicide Risk Assessment completed by Attending Physician prior to discharge.  Discharge destination:  Home  Is patient on multiple antipsychotic therapies at discharge:  No   Has Patient had three or more failed trials of antipsychotic monotherapy by history:  No  Recommended Plan for Multiple Antipsychotic Therapies: NA  Discharge Instructions    Diet - low sodium heart healthy   Complete by: As directed    Increase activity slowly  Complete by: As directed      Allergies as of 12/21/2018   No Known Allergies     Medication List    STOP taking these medications   benztropine 0.5 MG tablet Commonly known as: COGENTIN   temazepam 30 MG capsule Commonly known as: RESTORIL   VITAMIN D (CHOLECALCIFEROL) PO     TAKE these medications     Indication  carbamazepine 100 MG chewable tablet Commonly known as: TEGRETOL Chew 2 tablets (200 mg total) by mouth 2 (two) times a day.  Indication: Manic-Depression   risperiDONE 2 MG tablet Commonly known as: RISPERDAL Take 1 tablet (2 mg total) by mouth at bedtime. What changed:   medication strength  how much to take  Indication: Manic Phase of Manic-Depression      Follow-up Information    [facility] Call.   Why: Call to make an appointment between Monday and Friday 8am-3pm Contact information: [address] [place] [address] [phone]           Follow-up recommendations:  Activity:  ad lib  Comments: Take medications as directed, follow-up with your primary psychiatrist.  Signed: Antonieta Pert, MD 12/21/2018, 11:52 AM

## 2018-12-21 NOTE — Progress Notes (Signed)
  [facility] Adult Case Management Discharge Plan :  Will you be returning to the same living situation after discharge:  Yes,  pt will return home  At discharge, do you have transportation home?: Yes,  pt will be picked up by her mother  Do you have the ability to pay for your medications: No.  Release of information consent forms completed and in the chart;  Patient's signature needed at discharge.  Patient to Follow up at: Follow-up Information    [facility] Call.   Why: Call to make an appointment between Monday and Friday 8am-3pm Contact information: [place] [address] [phone]           Next level of care provider has access to [place] and Suicide Prevention discussed: Yes,  CSW discussed with pt   Have you used any form of tobacco in the last 30 days? (Cigarettes, Smokeless Tobacco, Cigars, and/or Pipes): Yes  Has patient been referred to the Quitline?: Patient refused referral  Patient has been referred for addiction treatment: Yes  Tabbitha Janvrin  CUEBAS-COLON, LCSW 12/21/2018, 10:41 AM

## 2018-12-21 NOTE — [facility] Suicide Risk Assessment (Signed)
[facility] Discharge Suicide Risk Assessment   Principal Problem: Bipolar 1 disorder [facility]) Discharge Diagnoses: Principal Problem:   Bipolar 1 disorder ([place])   Total Time spent with patient: 15 minutes  Musculoskeletal: Strength & Muscle Tone: within normal limits Gait & Station: normal Patient leans: N/A  Psychiatric Specialty Exam: Review of Systems  All other systems reviewed and are negative.   Blood pressure (!) 82/63, pulse 76, temperature 97.8 F (36.6 C), temperature source Oral, resp. rate 18, height 6\' 1"  (1.854 m), weight 91.2 kg, last menstrual period 12/14/2018, SpO2 99 %.Body mass index is 26.52 kg/m.  General Appearance: Casual  Eye Contact::  Good  Speech:  Normal Rate409  Volume:  Normal  Mood:  Euthymic  Affect:  Congruent  Thought Process:  Coherent and Descriptions of Associations: Intact  Orientation:  Full (Time, Place, and Person)  Thought Content:  Logical  Suicidal Thoughts:  No  Homicidal Thoughts:  No  Memory:  Immediate;   Fair Recent;   Fair Remote;   Fair  Judgement:  Intact  Insight:  Fair  Psychomotor Activity:  Normal  Concentration:  Good  Recall:  Good  Fund of Knowledge:Good  Language: Good  Akathisia:  Negative  Handed:  Right  AIMS (if indicated):     Assets:  Desire for Improvement Resilience  Sleep:  Number of Hours: 5  Cognition: WNL  ADL's:  Intact   Mental Status Per Nursing Assessment::   On Admission:  NA  Demographic Factors:  Low socioeconomic status  Loss Factors: NA  Historical Factors: Impulsivity  Risk Reduction Factors:   Positive coping skills or problem solving skills  Continued Clinical Symptoms:  Bipolar Disorder:   Depressive phase  Cognitive Features That Contribute To Risk:  None    Suicide Risk:  Minimal: No identifiable suicidal ideation.  Patients presenting with no risk factors but with morbid ruminations; may be classified as minimal risk based on the severity of the depressive  symptoms    Plan Of Care/Follow-up recommendations:  Activity:  ad lib  Sharma Covert, MD 12/21/2018, 9:09 AM

## 2018-12-21 NOTE — [facility] Group Notes (Signed)
Group cancelled due to COVID-19.  Vikki Gains  CUEBAS-COLON, LCSW  12/21/2018, 11:30AM 

## 2018-12-21 NOTE — Progress Notes (Signed)
Cooperative with treatment, pleasant on approach. Patient spent most of shift resting in bed quietly. Patient appears to be in bed resting quietly.

## 2018-12-21 NOTE — Progress Notes (Signed)
D- Patient alert and oriented. Patient presented in a pleasant mood on assessment stating that she slept ok last night and had no complaints/concerns to voice to this Probation officer. Patient denied SI, HI, AVH, and pain at this time. Patient also denied any signs/symptoms of depression/anxiety stating that overall, "I feel alright". Patient had no stated goals for today.  A- Scheduled medications administered to patient, per MD orders. Support and encouragement provided.  Routine safety checks conducted every 15 minutes.  Patient informed to notify staff with problems or concerns.  R- No adverse drug reactions noted. Patient contracts for safety at this time. Patient compliant with medications and treatment plan. Patient receptive, calm, and cooperative. Patient interacts well with others on the unit.  Patient remains safe at this time.

## 2018-12-22 NOTE — Progress Notes (Signed)
CSW spoke with pt regarding discharge plan. Pt refused to sign for [facility] stating she can schedule he own appointment.   Evalina Field, MSW, LCSW Clinical Social Work 12/22/2018 10:22 AM

## 2018-12-22 NOTE — Plan of Care (Signed)
Has been in bed. Pleasant and cooperative on approach with no concerns. Received medications and snack. Support and encouragements provided. Safety monitored as recommended.

## 2018-12-22 NOTE — Progress Notes (Signed)
  [facility] Adult Case Management Discharge Plan :  Will you be returning to the same living situation after discharge:  No. At discharge, do you have transportation home?: Yes,  pts mom will pick her up Do you have the ability to pay for your medications: Yes,  mental health  Release of information consent forms completed and in the chart;   Patient to Follow up at: Follow-up Information    [facility] Call.   Why: Call to make an appointment between Monday and Friday 8am-3pm Contact information: [place] [address] [phone]           Next level of care provider has access to [place] and Suicide Prevention discussed: Yes,  SPE completed with pts dad  Have you used any form of tobacco in the last 30 days? (Cigarettes, Smokeless Tobacco, Cigars, and/or Pipes): Yes  Has patient been referred to the Quitline?: Patient refused referral  Patient has been referred for addiction treatment: [place], LCSW 12/22/2018, 10:23 AM

## 2018-12-22 NOTE — Progress Notes (Signed)
Recreation Therapy Notes   Date: 12/22/2018  Time: 9:30 am    Recreation Therapy group canceled due to COVID-19.     Laurie Hooper LRT/CTRS        Laurie Hooper 12/22/2018 11:46 AM

## 2018-12-22 NOTE — Progress Notes (Signed)
[facility]  12/22/2018 10:44 AM Laurie AngelicaKhadejah C Hooper  MRN:  [id number]   Subjective: Follow-up for patient diagnosed with bipolar 1 disorder.  Patient reports that she is feeling good today.  She states that she is not having any suicidal or homicidal ideations and denies any hallucinations.  Patient reports that she does feel calmer and she feels that she is ready to go.  She reports that her mother or father either 1 can pick her up and she states that I can contact her mother, Laurie BradfordKimberly. Patient's mother, Laurie DeforestKimberly Hooper, was contacted for collateral information.  She reports that she did speak to her and there is concern about her not taking the injection.  She was informed that that cannot be forced on the patient at this time as she has been compliant with all of her other medications.  Patient's mother reports that she has the father on the follow-up after our conversation.  They were concerned about the patient remaining compliant with medications upon discharge but do state that they will come and pick her up from the hospital.  She states that she lives in [place] and will be here after she is finished with work.  Principal Problem: Bipolar 1 disorder (HCC) Diagnosis: Principal Problem:   Bipolar 1 disorder (HCC)  Total Time spent with patient: 20 minutes  Past Psychiatric History: Patient had a previous psychiatric hospitalization for several days in July of this year.  She was diagnosed with bipolar disorder and discharged on a combination of Tegretol and Risperdal.  Patient says she did follow-up with [facility] but in the last month has tapered herself off the medicine and feels better for it because she feels like she can think more clearly.  Denies any history of suicide attempts.  Had seen a counselor and had some psychiatric treatment with a diagnosis of bipolar prior to her hospitalization.  Past Medical History:  Past Medical History:  Diagnosis Date  . Anxiety   .  Medical history non-contributory   . Psychosis [facility](HCC)     Past Surgical History:  Procedure Laterality Date  . KNEE ARTHROSCOPY W/ ACL RECONSTRUCTION     Family History:  Family History  Problem Relation Age of Onset  . Diabetes Paternal Grandmother   . Kidney disease Father   . Thyroid disease Mother   . Cancer Neg Hx    Family Psychiatric  History: Patient seems to have only a vague idea of this.  She says she thinks her grandfather has mental health problems but she cannot specify in what way. Social History:  Social History   Substance and Sexual Activity  Alcohol Use Not Currently     Social History   Substance and Sexual Activity  Drug Use No    Social History   Socioeconomic History  . Marital status: Single    Spouse name: Not on file  . Number of children: Not on file  . Years of education: Not on file  . Highest education level: Not on file  Occupational History  . Not on file  Tobacco Use  . Smoking status: Former Smoker    Types: Cigarettes  . Smokeless tobacco: Never Used  Substance and Sexual Activity  . Alcohol use: Not Currently  . Drug use: No  . Sexual activity: Yes    Birth control/protection: None  Other Topics Concern  . Not on file  Social History Narrative  . Not on file   Social Determinants of Health  Financial Resource Strain:   . Difficulty of Paying Living Expenses: Not on file  Food Insecurity:   . Worried About Programme researcher, broadcasting/film/video in the Last Year: Not on file  . Ran Out of Food in the Last Year: Not on file  Transportation Needs:   . Lack of Transportation (Medical): Not on file  . Lack of Transportation (Non-Medical): Not on file  Physical Activity:   . Days of Exercise per Week: Not on file  . Minutes of Exercise per Session: Not on file  Stress:   . Feeling of Stress : Not on file  Social Connections:   . Frequency of Communication with Friends and Family: Not on file  . Frequency of Social Gatherings with Friends  and Family: Not on file  . Attends Religious Services: Not on file  . Active Member of Clubs or Organizations: Not on file  . Attends Banker Meetings: Not on file  . Marital Status: Not on file   Additional Social History:                         Sleep: Good  Appetite:  Good  Current Medications: Current Facility-Administered Medications  Medication Dose Route Frequency Provider Last Rate Last Admin  . acetaminophen (TYLENOL) tablet 650 mg  650 mg Oral Q6H PRN Cristofano, Worthy Rancher, MD      . alum & mag hydroxide-simeth (MAALOX/MYLANTA) 200-200-20 MG/5ML suspension 30 mL  30 mL Oral Q4H PRN Cristofano, Paul A, MD      . influenza vac split quadrivalent PF (FLUARIX) injection 0.5 mL  0.5 mL Intramuscular Tomorrow-1000 Clapacs, John T, MD      . magnesium hydroxide (MILK OF MAGNESIA) suspension 30 mL  30 mL Oral Daily PRN Cristofano, Worthy Rancher, MD      . Melatonin TABS 5 mg  5 mg Oral QHS Cristofano, Paul A, MD   5 mg at 12/21/18 2246  . nicotine (NICODERM CQ - dosed in mg/24 hours) patch 21 mg  21 mg Transdermal Daily Clapacs, Jackquline Denmark, MD   21 mg at 12/22/18 0933  . risperiDONE (RISPERDAL) tablet 2 mg  2 mg Oral QHS Clapacs, Jackquline Denmark, MD   2 mg at 12/21/18 2246  . temazepam (RESTORIL) capsule 30 mg  30 mg Oral QHS PRN Cristofano, Worthy Rancher, MD        Lab Results:  Results for orders placed or performed during the hospital encounter of 12/15/18 (from the past 48 hour(s))  Respiratory Panel by RT PCR (Flu A&B, Covid) -     Status: None   Collection Time: 12/20/18  7:30 PM  Result Value Ref Range   SARS Coronavirus 2 by RT PCR NEGATIVE NEGATIVE    Comment: (NOTE) SARS-CoV-2 target nucleic acids are NOT DETECTED. The SARS-CoV-2 RNA is generally detectable in upper respiratoy specimens during the acute phase of infection. The lowest concentration of SARS-CoV-2 viral copies this assay can detect is 131 copies/mL. A negative result does not preclude SARS-Cov-2 infection and  should not be used as the sole basis for treatment or other patient management decisions. A negative result may occur with  improper specimen collection/handling, submission of specimen other than nasopharyngeal swab, presence of viral mutation(s) within the areas targeted by this assay, and inadequate number of viral copies (<131 copies/mL). A negative result must be combined with clinical observations, patient history, and epidemiological information. The expected result is Negative. Fact Sheet for Patients:  PinkCheek.be Fact Sheet for Healthcare Providers:  GravelBags.it This test is not yet ap proved or cleared by the [place] FDA and  has been authorized for detection and/or diagnosis of SARS-CoV-2 by FDA under an Emergency Use Authorization (EUA). This EUA will remain  in effect (meaning this test can be used) for the duration of the COVID-19 declaration under Section 564(b)(1) of the Act, 21 U.S.C. section 360bbb-3(b)(1), unless the authorization is terminated or revoked sooner.    Influenza A by PCR NEGATIVE NEGATIVE   Influenza B by PCR NEGATIVE NEGATIVE    Comment: (NOTE) The Xpert Xpress SARS-CoV-2/FLU/RSV assay is intended as an aid in  the diagnosis of influenza from Nasopharyngeal swab specimens and  should not be used as a sole basis for treatment. Nasal washings and  aspirates are unacceptable for Xpert Xpress SARS-CoV-2/FLU/RSV  testing. Fact Sheet for Patients: PinkCheek.be Fact Sheet for Healthcare Providers: GravelBags.it This test is not yet approved or cleared by the [place] FDA and  has been authorized for detection and/or diagnosis of SARS-CoV-2 by  FDA under an Emergency Use Authorization (EUA). This EUA will remain  in effect (meaning this test can be used) for the duration of the  Covid-19 declaration under Section 564(b)(1) of  the Act, 21  U.S.C. section 360bbb-3(b)(1), unless the authorization is  terminated or revoked. Performed at [facility], [address] [place], [place] [address]     Blood Alcohol level:  Lab Results  Component Value Date   [facility] <10 12/15/2018   ETH <10 50/09/3816    Metabolic Disorder Labs: Lab Results  Component Value Date   HGBA1C 5.3 07/29/2018   MPG 105.41 07/29/2018   Lab Results  Component Value Date   PROLACTIN 107.9 (H) 07/29/2018   Lab Results  Component Value Date   CHOL 155 12/16/2018   TRIG 76 12/16/2018   HDL 36 (L) 12/16/2018   CHOLHDL 4.3 12/16/2018   VLDL 15 12/16/2018   LDLCALC 104 (H) 12/16/2018   LDLCALC 123 (H) 07/29/2018    Physical Findings: AIMS:  , ,  ,  ,    CIWA:    COWS:     Musculoskeletal: Strength & Muscle Tone: within normal limits Gait & Station: normal Patient leans: N/A  Psychiatric Specialty Exam: Physical Exam  Nursing note and vitals reviewed. Constitutional: She is oriented to person, place, and time. She appears well-developed and well-nourished.  Cardiovascular: Normal rate.  Respiratory: Effort normal.  Musculoskeletal:        General: Normal range of motion.  Neurological: She is alert and oriented to person, place, and time.  Skin: Skin is warm.    Review of Systems  Constitutional: Negative.   HENT: Negative.   Eyes: Negative.   Respiratory: Negative.   Cardiovascular: Negative.   Gastrointestinal: Negative.   Genitourinary: Negative.   Musculoskeletal: Negative.   Skin: Negative.   Neurological: Negative.   Psychiatric/Behavioral: Negative.     Blood pressure 102/66, pulse 94, temperature 97.8 F (36.6 C), temperature source Oral, resp. rate 16, height 6\' 1"  (1.854 m), weight 91.2 kg, last menstrual period 12/14/2018, SpO2 100 %.Body mass index is 26.52 kg/m.  General Appearance: Casual  Eye Contact:  Good  Speech:  Clear and Coherent and Normal Rate  Volume:  Normal  Mood:   Euthymic  Affect:  Congruent  Thought Process:  Coherent and Descriptions of Associations: Intact  Orientation:  Full (Time, Place, and Person)  Thought Content:  WDL  Suicidal Thoughts:  No  Homicidal Thoughts:  No  Memory:  Immediate;   Fair Recent;   Fair Remote;   Fair  Judgement:  Intact  Insight:  Fair  Psychomotor Activity:  Normal  Concentration:  Concentration: Good  Recall:  Fair  Fund of Knowledge:  Fair  Language:  Good  Akathisia:  No  Handed:  Right  AIMS (if indicated):     Assets:  Desire for Improvement Housing Resilience Social Support  ADL's:  Intact  Cognition:  WNL  Sleep:  Number of Hours: 5.45   Assessment: Patient presents in her room lying in the bed but is awake.  Patient is pleasant, calm, cooperative.  Patient reported that either her mother or her father can pick her up from the hospital today.  She agrees to allow me to contact her mother.  Mother's reports that she was a little surprised that would be able to pick the patient up this afternoon when she gets them with work.  She states she lives in [place].  The patient was discharged yesterday but no one came to pick the patient up.  Patient has continued being compliant with her medication has continued denying any suicidal or homicidal ideations and denies any hallucinations.  There have been no complaints about the patient thus far on the unit.  Treatment Plan Summary: Daily contact with patient to assess and evaluate symptoms and progress in treatment and Medication management Continue Risperdal 2 mg p.o. nightly for bipolar disorder Continue every 15 minute safety check till discharged   Maryfrances Bunnell, FNP 12/22/2018, 10:44 AM

## 2018-12-22 NOTE — BHH Group Notes (Signed)
LCSW Group Therapy Note   12/22/2018 1:00 PM  Type of Therapy and Topic:  Group Therapy:  Overcoming Obstacles   Participation Level:  Did Not Attend   Description of Group:    In this group patients will be encouraged to explore what they see as obstacles to their own wellness and recovery. They will be guided to discuss their thoughts, feelings, and behaviors related to these obstacles. The group will process together ways to cope with barriers, with attention given to specific choices patients can make. Each patient will be challenged to identify changes they are motivated to make in order to overcome their obstacles. This group will be process-oriented, with patients participating in exploration of their own experiences as well as giving and receiving support and challenge from other group members.   Therapeutic Goals: 1. Patient will identify personal and current obstacles as they relate to admission. 2. Patient will identify barriers that currently interfere with their wellness or overcoming obstacles.  3. Patient will identify feelings, thought process and behaviors related to these barriers. 4. Patient will identify two changes they are willing to make to overcome these obstacles:      Summary of Patient Progress Social Work group canceled due to Covid-19.      Therapeutic Modalities:   Cognitive Behavioral Therapy Solution Focused Therapy Motivational Interviewing Relapse Prevention Therapy  Kahleel Fadeley, MSW, LCSW 12/22/2018 1:12 PM   

## 2018-12-22 NOTE — Progress Notes (Signed)
Recreation Therapy Notes  INPATIENT RECREATION TR PLAN  Patient Details Name: Laurie Hooper MRN: [id number] DOB: May 05, 1990 Today's Date: 12/22/2018  Rec Therapy Plan Is patient appropriate for Therapeutic Recreation?: Yes Treatment times per week: at least 3 Estimated Length of Stay: 5-7 days TR Treatment/Interventions: Group participation (Comment)  Discharge Criteria Pt will be discharged from therapy if:: Discharged Treatment plan/goals/alternatives discussed and agreed upon by:: Patient/family  Discharge Summary Short term goals set: Patient will engage in groups without prompting or encouragement from LRT x3 group sessions within 5 recreation therapy group sessions Short term goals met: Complete Progress toward goals comments: Groups attended Which groups?: Leisure education, Stress management, Other (Comment)(Relaxation, Problem Solving) Reason goals not met: N/A Therapeutic equipment acquired: N/A Reason patient discharged from therapy: Discharge from hospital Pt/family agrees with progress & goals achieved: Yes Date patient discharged from therapy: 12/22/18   Khaliah Barnick 12/22/2018, 11:30 AM

## 2018-12-22 NOTE — Plan of Care (Signed)
  Problem: Group Participation Goal: STG - Patient will engage in groups without prompting or encouragement from LRT x3 group sessions within 5 recreation therapy group sessions Description: STG - Patient will engage in groups without prompting or encouragement from LRT x3 group sessions within 5 recreation therapy group sessions Outcome: Completed/Met

## 2018-12-22 NOTE — Progress Notes (Signed)
Recreation Therapy Notes   Date: 12/22/2018  Time: 9:30 am    Patient offered activity book and accepted.    Nakota Ackert LRT/CTRS        Javier Mamone 12/22/2018 11:31 AM

## 2018-12-22 NOTE — Progress Notes (Signed)
Patient denies SI/HI, denies A/V hallucinations. Patient verbalizes understanding of discharge instructions, follow up care and prescriptions which is emailed to pharmacy. Patient given all belongings from [facility] locker. Patient escorted out by staff, transported by family.

## 2019-01-03 IMAGING — CR DG LUMBAR SPINE COMPLETE 4+V
5 series · 5 of 5 positions shown · non-contrast
Comparison: None.

CLINICAL DATA: 26-year-old female with back pain.

EXAM:
LUMBAR SPINE - COMPLETE 4+ VIEW

[t lumbar spine lat]
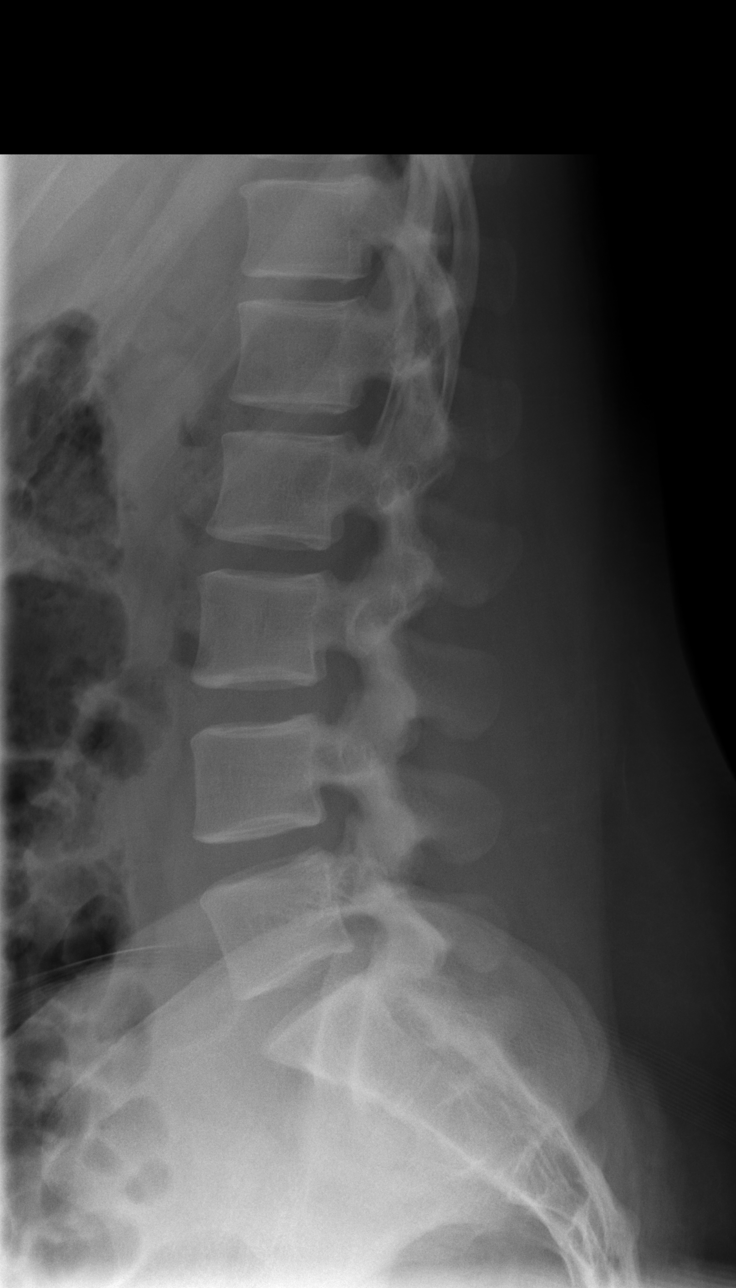

[t lumbar l-5 s-1 spot]
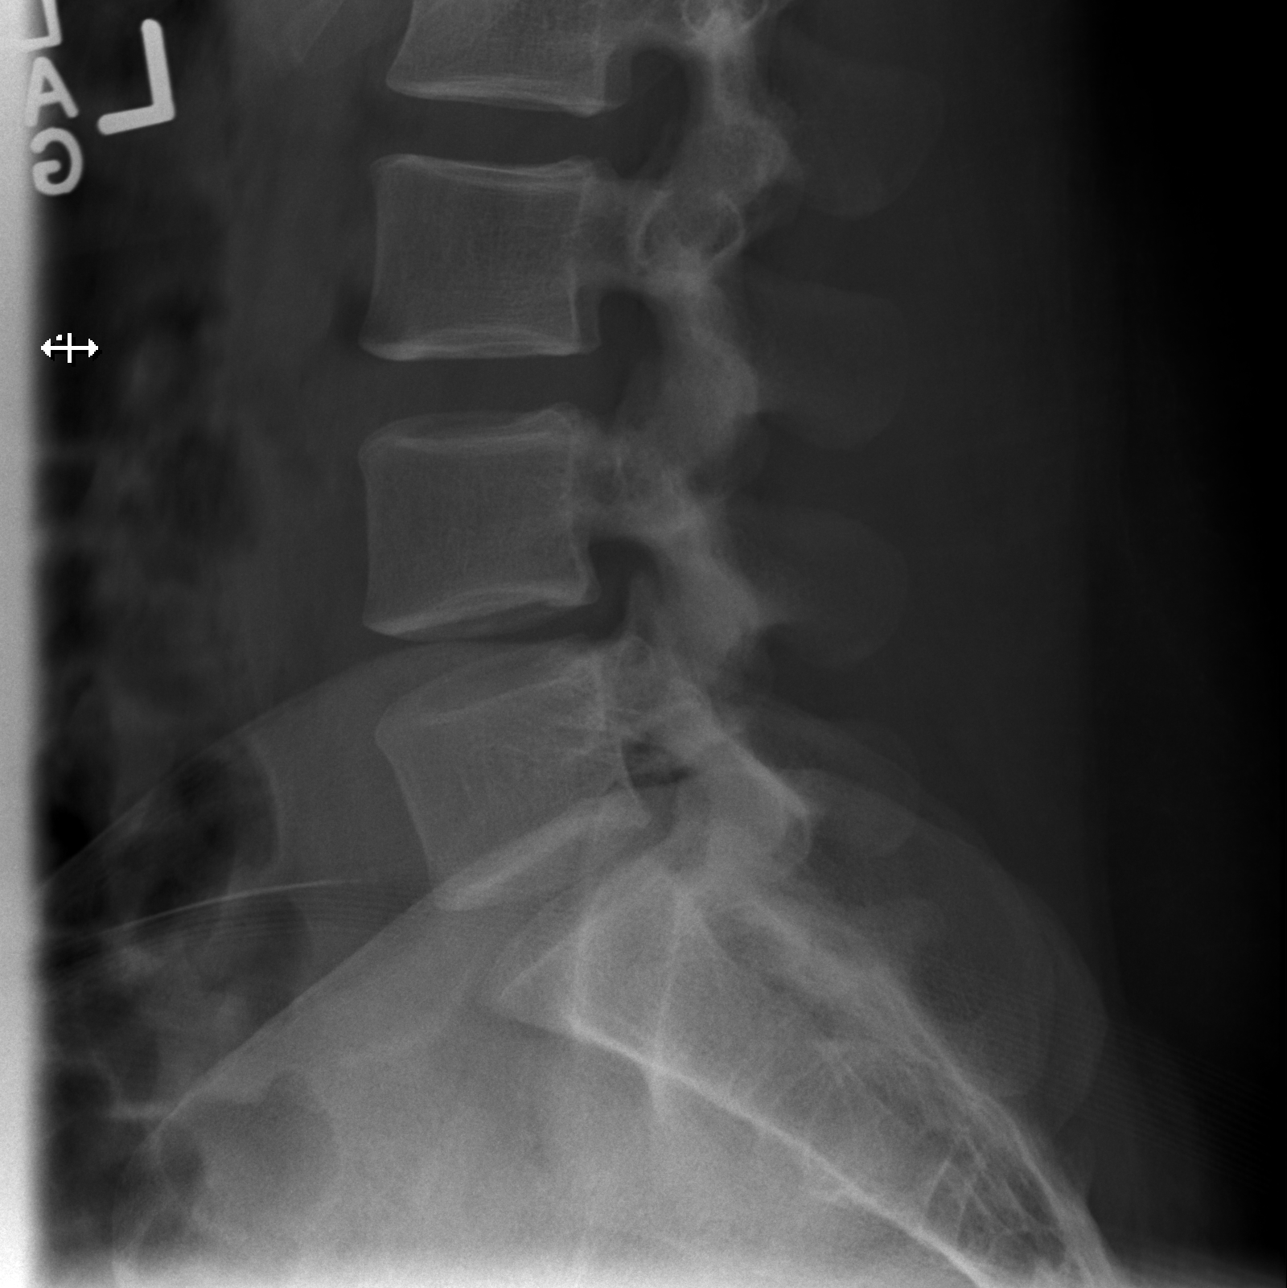

[t lumbar spine obl (1 of 2)]
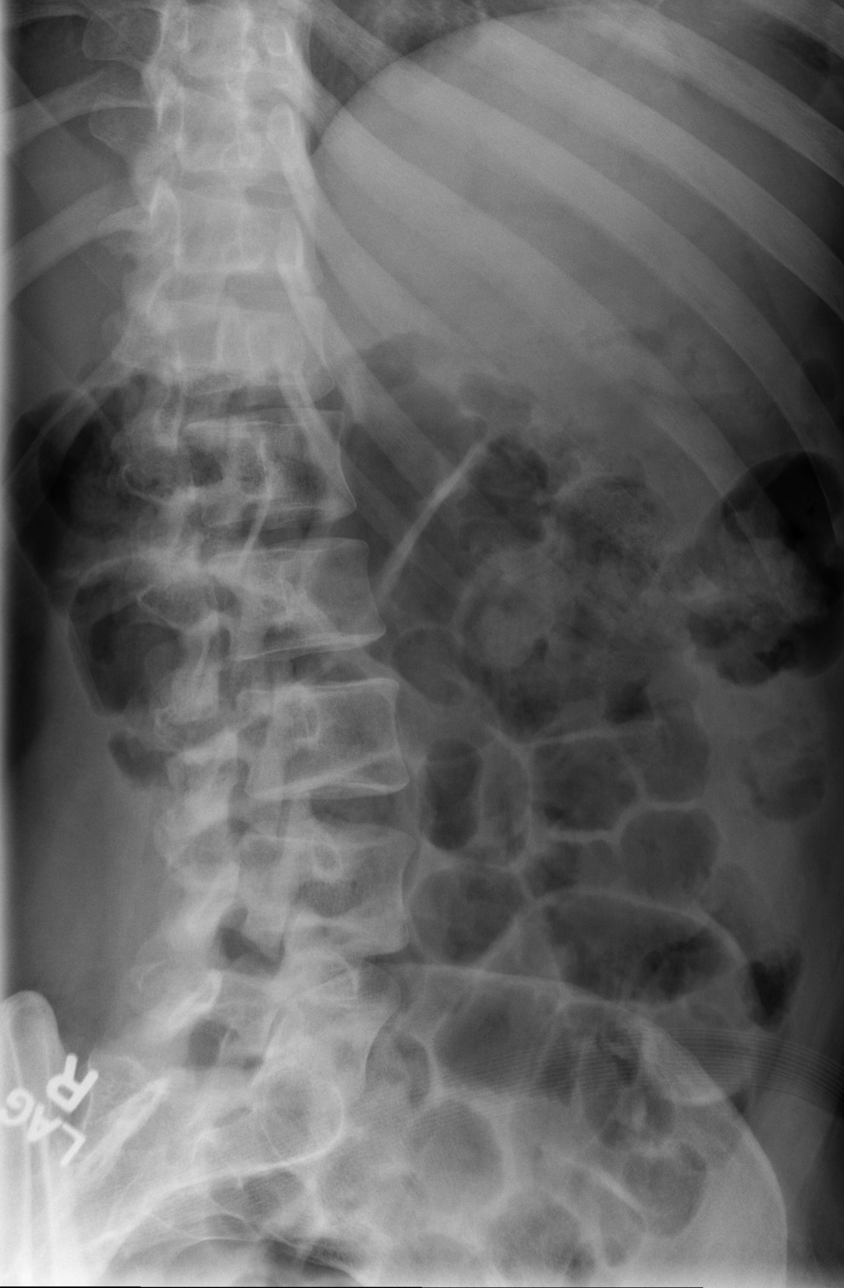

[t lumbar spine ap]
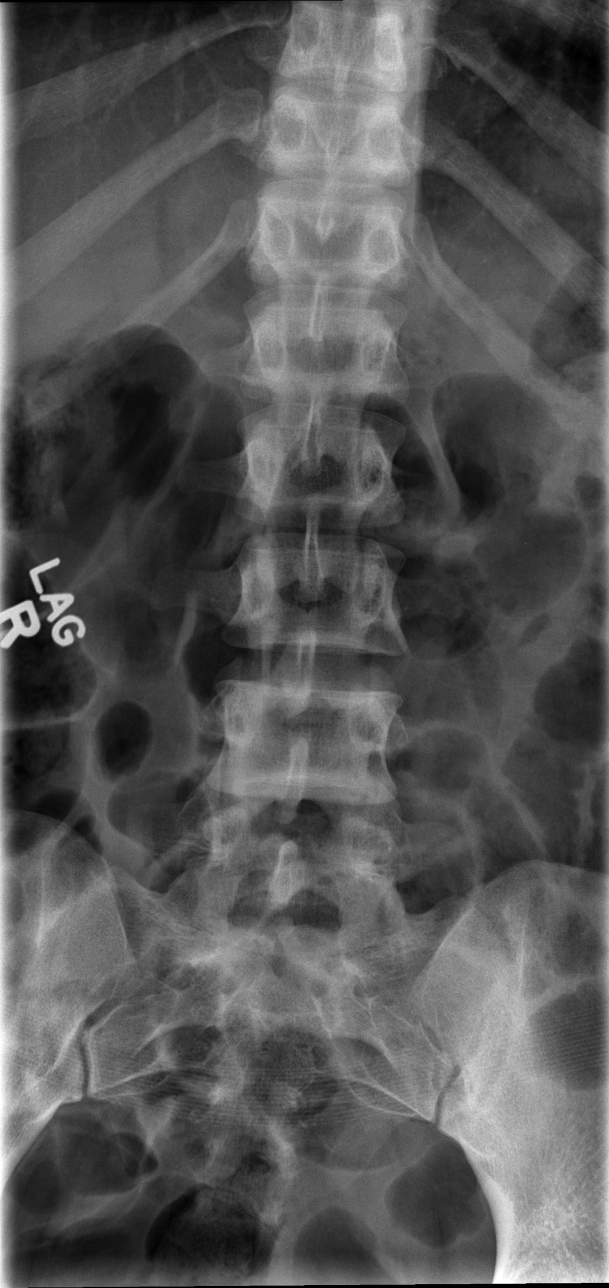

[t lumbar spine obl (2 of 2)]
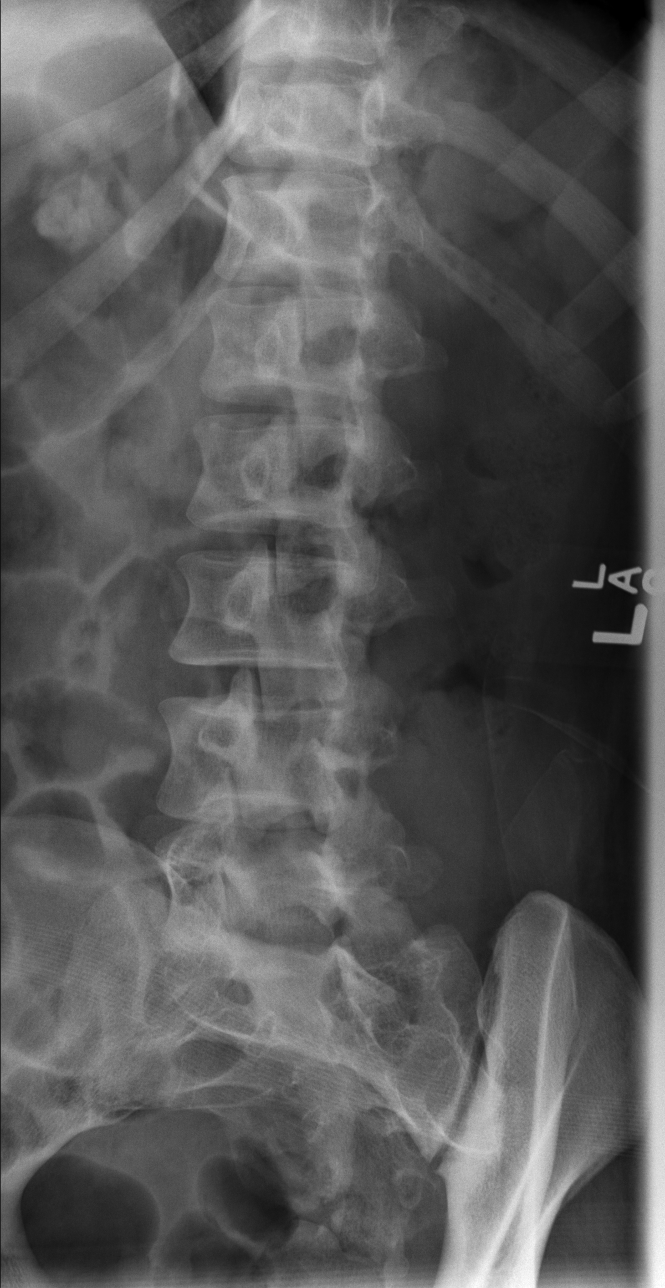

[5 of 5 positions shown; findings below may reference images not displayed]

FINDINGS: There is no evidence of lumbar spine fracture. Alignment is normal.
Intervertebral disc spaces are maintained.
IMPRESSION: Negative.

## 2019-06-17 ENCOUNTER — Emergency Department (HOSPITAL_COMMUNITY)
Admission: EM | Admit: 2019-06-17 | Discharge: 2019-06-18 | Disposition: A | Discharge status: Other Acute Inpatient Hospital | Payer: Self-pay | Attending: Emergency Medicine | Admitting: Emergency Medicine

## 2019-06-17 ENCOUNTER — Other Ambulatory Visit: Payer: Self-pay

## 2019-06-17 ENCOUNTER — Encounter (HOSPITAL_COMMUNITY): Payer: Self-pay | Admitting: Emergency Medicine

## 2019-06-17 DIAGNOSIS — Z79899 Other long term (current) drug therapy: Secondary | ICD-10-CM | POA: Insufficient documentation

## 2019-06-17 DIAGNOSIS — Z20822 Contact with and (suspected) exposure to covid-19: Secondary | ICD-10-CM | POA: Insufficient documentation

## 2019-06-17 DIAGNOSIS — F29 Unspecified psychosis not due to a substance or known physiological condition: Secondary | ICD-10-CM

## 2019-06-17 DIAGNOSIS — F315 Bipolar disorder, current episode depressed, severe, with psychotic features: Secondary | ICD-10-CM | POA: Insufficient documentation

## 2019-06-17 LAB — COMPREHENSIVE METABOLIC PANEL
ALT: 13 U/L (ref 0–44)
AST: 14 U/L — ABNORMAL LOW (ref 15–41)
Albumin: 4.3 g/dL (ref 3.5–5.0)
Alkaline Phosphatase: 45 U/L (ref 38–126)
Anion gap: 9 (ref 5–15)
BUN: 13 mg/dL (ref 6–20)
CO2: 26 mmol/L (ref 22–32)
Calcium: 9.1 mg/dL (ref 8.9–10.3)
Chloride: 104 mmol/L (ref 98–111)
Creatinine, Ser: 0.88 mg/dL (ref 0.44–1.00)
GFR calc Af Amer: >60 mL/min (ref >60)
GFR calc non Af Amer: >60 mL/min (ref >60)
Glucose, Bld: 109 mg/dL — ABNORMAL HIGH (ref 70–99)
Potassium: 3.6 mmol/L (ref 3.5–5.1)
Sodium: 139 mmol/L (ref 135–145)
Total Bilirubin: 1.4 mg/dL — ABNORMAL HIGH (ref 0.3–1.2)
Total Protein: 7.1 g/dL (ref 6.5–8.1)

## 2019-06-17 LAB — CBC
HCT: 39.7 % (ref 36.0–46.0)
Hemoglobin: 13.3 g/dL (ref 12.0–15.0)
MCH: 29 pg (ref 26.0–34.0)
MCHC: 33.5 g/dL (ref 30.0–36.0)
MCV: 86.7 fL (ref 80.0–100.0)
Platelets: 259 10*3/uL (ref 150–400)
RBC: 4.58 MIL/uL (ref 3.87–5.11)
RDW: 11.8 % (ref 11.5–15.5)
WBC: 7.1 10*3/uL (ref 4.0–10.5)
nRBC: 0 % (ref 0.0–0.2)

## 2019-06-17 LAB — ACETAMINOPHEN LEVEL: Acetaminophen (Tylenol), Serum: <10 ug/mL — ABNORMAL LOW (ref 10–30)

## 2019-06-17 LAB — ETHANOL: Alcohol, Ethyl (B): <10 mg/dL (ref <10)

## 2019-06-17 LAB — SALICYLATE LEVEL: Salicylate Lvl: <7 mg/dL — ABNORMAL LOW (ref 7.0–30.0)

## 2019-06-17 LAB — CARBAMAZEPINE LEVEL, TOTAL: Carbamazepine Lvl: <2 ug/mL — ABNORMAL LOW (ref 4.0–12.0)

## 2019-06-17 MED ORDER — ALUM & MAG HYDROXIDE-SIMETH 200-200-20 MG/5ML PO SUSP: 30.0000 mL | Freq: Four times a day (QID) | ORAL | Status: DC | PRN

## 2019-06-17 MED ORDER — ONDANSETRON HCL 4 MG PO TABS
4.0000 mg | ORAL_TABLET | Freq: Three times a day (TID) | ORAL | Status: DC | PRN
  Administered 2019-06-17: 4 mg via ORAL
  Filled 2019-06-17: qty 1

## 2019-06-17 MED ORDER — DIPHENHYDRAMINE HCL 25 MG PO CAPS
50.0000 mg | ORAL_CAPSULE | Freq: Once | ORAL | Status: AC
  Administered 2019-06-17: 50 mg via ORAL
  Filled 2019-06-17: qty 2

## 2019-06-17 MED ORDER — ACETAMINOPHEN 325 MG PO TABS: 650.0000 mg | ORAL_TABLET | ORAL | Status: DC | PRN

## 2019-06-17 MED ORDER — ZOLPIDEM TARTRATE 5 MG PO TABS: 5.0000 mg | ORAL_TABLET | Freq: Every evening | ORAL | Status: DC | PRN

## 2019-06-17 NOTE — ED Notes (Signed)
Patient given graham crackers and peanut butter at this time.  

## 2019-06-17 NOTE — ED Notes (Signed)
Patient is aware that a urine specimen is still needed. Patient resting on stretcher at this time.
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
## 2019-06-17 NOTE — Care Management (Signed)
Writer referred to the following facilities:  [facility] Details Fax  [address] [place] [address]  Internal comment [facility] Details Fax  [address] [place] [address]  Internal comment [facility] Details Fax  [address], [place] [address]  Internal comment [facility] Details Fax  [address] [place] [address]  Internal comment [facility] Details Fax  [address], [place] [address]  Internal comment [facility] Details Fax  [address] [place], [place] [address]  Internal comment [facility] Details Fax  [phone]. [address], [place] [address]  Internal comment [facility] Details Fax  [address], [place] [address]  Internal comment [facility] Details Fax  [address] [place] [address]  Internal comment [facility] Details Fax  [address] [place] [address]  Internal comment [facility] Details Fax  [address] [place] [address]  Internal comment [facility] Office Details Fax  [address], [place] [address]  Internal comment [facility] Details Fax  [address] [place], [place] [address]  Internal comment

## 2019-06-18 ENCOUNTER — Inpatient Hospital Stay (HOSPITAL_COMMUNITY)
Admission: AD | Admit: 2019-06-18 | Discharge: 2019-06-22 | DRG: 885 | Disposition: A | Discharge status: Home / Self Care | Payer: Federal, State, Local not specified - Other | Attending: Psychiatry | Admitting: Psychiatry

## 2019-06-18 ENCOUNTER — Other Ambulatory Visit: Payer: Self-pay

## 2019-06-18 ENCOUNTER — Encounter (HOSPITAL_COMMUNITY): Payer: Self-pay | Admitting: Family

## 2019-06-18 DIAGNOSIS — Z87891 Personal history of nicotine dependence: Secondary | ICD-10-CM

## 2019-06-18 DIAGNOSIS — F319 Bipolar disorder, unspecified: Secondary | ICD-10-CM | POA: Diagnosis not present

## 2019-06-18 DIAGNOSIS — G4700 Insomnia, unspecified: Secondary | ICD-10-CM | POA: Diagnosis present

## 2019-06-18 DIAGNOSIS — F209 Schizophrenia, unspecified: Secondary | ICD-10-CM | POA: Insufficient documentation

## 2019-06-18 DIAGNOSIS — F4310 Post-traumatic stress disorder, unspecified: Secondary | ICD-10-CM | POA: Diagnosis present

## 2019-06-18 DIAGNOSIS — F3160 Bipolar disorder, current episode mixed, unspecified: Secondary | ICD-10-CM | POA: Diagnosis present

## 2019-06-18 LAB — RAPID URINE DRUG SCREEN, HOSP PERFORMED
Amphetamines: NOT DETECTED
Barbiturates: NOT DETECTED
Benzodiazepines: NOT DETECTED
Cocaine: NOT DETECTED
Opiates: NOT DETECTED
Tetrahydrocannabinol: NOT DETECTED

## 2019-06-18 LAB — POC URINE PREG, ED: Preg Test, Ur: NEGATIVE

## 2019-06-18 LAB — SARS CORONAVIRUS 2 BY RT PCR (HOSPITAL ORDER, PERFORMED IN ~~LOC~~ HOSPITAL LAB): SARS Coronavirus 2: NEGATIVE

## 2019-06-18 MED ORDER — DIPHENHYDRAMINE HCL 50 MG PO CAPS
50.0000 mg | ORAL_CAPSULE | Freq: Once | ORAL | Status: AC
  Administered 2019-06-18: 50 mg via ORAL
  Filled 2019-06-18: qty 2
  Filled 2019-06-18: qty 1

## 2019-06-18 MED ORDER — OLANZAPINE 5 MG PO TABS
5.0000 mg | ORAL_TABLET | Freq: Two times a day (BID) | ORAL | Status: DC
  Administered 2019-06-18: 5 mg via ORAL
  Filled 2019-06-18: qty 1

## 2019-06-18 MED ORDER — CARBAMAZEPINE 100 MG PO CHEW
200.0000 mg | CHEWABLE_TABLET | Freq: Two times a day (BID) | ORAL | Status: DC
  Administered 2019-06-18 – 2019-06-22 (×8): 200 mg via ORAL
  Filled 2019-06-18 (×4): qty 2
  Filled 2019-06-18: qty 28
  Filled 2019-06-18 (×3): qty 2
  Filled 2019-06-18: qty 28
  Filled 2019-06-18 (×5): qty 2

## 2019-06-18 MED ORDER — HYDROXYZINE HCL 25 MG PO TABS
25.0000 mg | ORAL_TABLET | Freq: Three times a day (TID) | ORAL | Status: DC | PRN
  Administered 2019-06-19: 25 mg via ORAL
  Filled 2019-06-18 (×2): qty 1
  Filled 2019-06-18: qty 10

## 2019-06-18 MED ORDER — OLANZAPINE 5 MG PO TABS
5.0000 mg | ORAL_TABLET | Freq: Two times a day (BID) | ORAL | Status: DC
  Administered 2019-06-18: 5 mg via ORAL
  Filled 2019-06-18 (×3): qty 1
  Filled 2019-06-18 (×2): qty 2
  Filled 2019-06-18: qty 1

## 2019-06-18 MED ORDER — MAGNESIUM HYDROXIDE 400 MG/5ML PO SUSP: 30.0000 mL | Freq: Every day | ORAL | Status: DC | PRN

## 2019-06-18 NOTE — ED Notes (Signed)
Attempted to ask pt. If they wanted [place] to notify pt. Contact. Pt. Unable to answer. Pt. Is having flight of ideas and is very tearful.

## 2019-06-18 NOTE — ED Notes (Signed)
Called for transport to [facility]. °

## 2019-06-18 NOTE — BH Assessment (Signed)
[facility] Assessment Progress Note  Per Caryn Bee, DNP, this pt requires psychiatric hospitalization at this time.  Percell Boston, RN has tentatively assigned pt to [facility] Rm 502-2 pending negative Covid-19 test results.  As of this writing results are pending.  Pt's nurse, Florentina Addison, has been notified.  Please call report to [phone] when the time comes.  Doylene Canning, [place] Behavioral Health Coordinator [phone]

## 2019-06-18 NOTE — Tx Team (Signed)
Initial Treatment Plan 06/18/2019 11:26 PM Laurie Hooper QWB:[id number]    PATIENT STRESSORS: Financial difficulties Legal issue Marital or family conflict Medication change or noncompliance   PATIENT STRENGTHS: Capable of independent living Wellsite geologist fund of knowledge Motivation for treatment/growth Physical Health   PATIENT IDENTIFIED PROBLEMS:   "build on ways to get housing"    "work on maintaining stress relief"               DISCHARGE CRITERIA:  Ability to meet basic life and health needs Adequate post-discharge living arrangements Improved stabilization in mood, thinking, and/or behavior Motivation to continue treatment in a less acute level of care Need for constant or close observation no longer present Safe-care adequate arrangements made Verbal commitment to aftercare and medication compliance  PRELIMINARY DISCHARGE PLAN: Attend PHP/IOP Outpatient therapy Placement in alternative living arrangements  PATIENT/FAMILY INVOLVEMENT: This treatment plan has been presented to and reviewed with the patient, Laurie Hooper  The patient has been given the opportunity to ask questions and make suggestions.  Carlisle Cater, RN 06/18/2019, 11:26 PM

## 2019-06-18 NOTE — Progress Notes (Signed)
Patient ID: Laurie Hooper, female   DOB: [birth year], 29 y.o.   MRN: [id number] Patient continues to meet inpatient criteria. Patient has been accepted at the [facility] 500-2. Accepting physician Dr. Jama Flavors. Patient can come at 1400.

## 2019-06-18 NOTE — ED Notes (Signed)
edh     

## 2019-06-19 DIAGNOSIS — F319 Bipolar disorder, unspecified: Secondary | ICD-10-CM

## 2019-06-19 LAB — LIPID PANEL
Cholesterol: 145 mg/dL (ref 0–200)
HDL: 33 mg/dL — ABNORMAL LOW (ref >40)
LDL Cholesterol: 94 mg/dL (ref 0–99)
Total CHOL/HDL Ratio: 4.4 RATIO
Triglycerides: 91 mg/dL (ref <150)
VLDL: 18 mg/dL (ref 0–40)

## 2019-06-19 LAB — HEMOGLOBIN A1C
Hgb A1c MFr Bld: 6 % — ABNORMAL HIGH (ref 4.8–5.6)
Mean Plasma Glucose: 125.5 mg/dL

## 2019-06-19 LAB — TSH: TSH: 0.537 u[IU]/mL (ref 0.350–4.500)

## 2019-06-19 MED ORDER — OLANZAPINE 5 MG PO TBDP
5.0000 mg | ORAL_TABLET | Freq: Every day | ORAL | Status: DC
  Administered 2019-06-19 – 2019-06-22 (×4): 5 mg via ORAL
  Filled 2019-06-19 (×4): qty 1
  Filled 2019-06-19: qty 21
  Filled 2019-06-19 (×2): qty 1

## 2019-06-19 MED ORDER — ZIPRASIDONE MESYLATE 20 MG IM SOLR: 20.0000 mg | INTRAMUSCULAR | Status: DC | PRN

## 2019-06-19 MED ORDER — LORAZEPAM 1 MG PO TABS: 2.0000 mg | ORAL_TABLET | Freq: Four times a day (QID) | ORAL | Status: DC | PRN

## 2019-06-19 MED ORDER — LORAZEPAM 1 MG PO TABS: 1.0000 mg | ORAL_TABLET | ORAL | Status: DC | PRN

## 2019-06-19 MED ORDER — OLANZAPINE 10 MG PO TBDP: 10.0000 mg | ORAL_TABLET | Freq: Three times a day (TID) | ORAL | Status: DC | PRN

## 2019-06-19 MED ORDER — OLANZAPINE 10 MG PO TBDP
10.0000 mg | ORAL_TABLET | Freq: Every day | ORAL | Status: DC
  Administered 2019-06-19 – 2019-06-21 (×3): 10 mg via ORAL
  Filled 2019-06-19: qty 7
  Filled 2019-06-19 (×4): qty 1

## 2019-06-19 MED ORDER — OLANZAPINE 5 MG PO TABS: 5.0000 mg | ORAL_TABLET | Freq: Every day | ORAL | Status: DC

## 2019-06-19 NOTE — Progress Notes (Signed)
   06/19/19 2000  COVID-19 Daily Checkoff  Have you had a fever (temp > 37.80C/100F)  in the past 24 hours?  No  COVID-19 EXPOSURE  Have you traveled outside the state in the past 14 days? No  Have you been in contact with someone with a confirmed diagnosis of COVID-19 or PUI in the past 14 days without wearing appropriate PPE? No  Have you been living in the same home as a person with confirmed diagnosis of COVID-19 or a PUI (household contact)? No  Have you been diagnosed with COVID-19? No

## 2019-06-19 NOTE — Tx Team (Signed)
Interdisciplinary Treatment and Diagnostic Plan Update  06/19/2019 Time of Session: 9:15am Laurie Hooper MRN: [id number]  Principal Diagnosis: <principal problem not specified>  Secondary Diagnoses: Active Problems:   Schizophrenia ([place])   Current Medications:  Current Facility-Administered Medications  Medication Dose Route Frequency Provider Last Rate Last Admin  . carbamazepine (TEGRETOL) chewable tablet 200 mg  200 mg Oral BID Suella Broad, FNP   200 mg at 06/19/19 0817  . hydrOXYzine (ATARAX/VISTARIL) tablet 25 mg  25 mg Oral TID PRN Suella Broad, FNP      . OLANZapine zydis (ZYPREXA) disintegrating tablet 10 mg  10 mg Oral Q8H PRN Sharma Covert, MD       And  . LORazepam (ATIVAN) tablet 1 mg  1 mg Oral PRN Sharma Covert, MD       And  . ziprasidone (GEODON) injection 20 mg  20 mg Intramuscular PRN Sharma Covert, MD      . LORazepam (ATIVAN) tablet 2 mg  2 mg Oral Q6H PRN Sharma Covert, MD      . magnesium hydroxide (MILK OF MAGNESIA) suspension 30 mL  30 mL Oral Daily PRN Suella Broad, FNP      . OLANZapine zydis (ZYPREXA) disintegrating tablet 10 mg  10 mg Oral QHS Sharma Covert, MD      . OLANZapine zydis [facility]) disintegrating tablet 5 mg  5 mg Oral Daily Sharma Covert, MD   5 mg at 06/19/19 7672   PTA Medications: Medications Prior to Admission  Medication Sig Dispense Refill Last Dose  . carbamazepine (TEGRETOL) 100 MG chewable tablet Chew 2 tablets (200 mg total) by mouth 2 (two) times a day. (Patient not taking: Reported on 12/15/2018) 120 tablet 1   . risperiDONE (RISPERDAL) 2 MG tablet Take 1 tablet (2 mg total) by mouth at bedtime. 30 tablet 0     Patient Stressors: Arts development officer issue Marital or family conflict Medication change or noncompliance  Patient Strengths: Capable of independent living Curator fund of knowledge Motivation for  treatment/growth Physical Health  Treatment Modalities: Medication Management, Group therapy, Case management,  1 to 1 session with clinician, Psychoeducation, Recreational therapy.   Physician Treatment Plan for Primary Diagnosis: <principal problem not specified> Long Term Goal(s):     Short Term Goals:    Medication Management: Evaluate patient's response, side effects, and tolerance of medication regimen.  Therapeutic Interventions: 1 to 1 sessions, Unit Group sessions and Medication administration.  Evaluation of Outcomes: Not Met  Physician Treatment Plan for Secondary Diagnosis: Active Problems:   Schizophrenia ([place])  Long Term Goal(s):     Short Term Goals:       Medication Management: Evaluate patient's response, side effects, and tolerance of medication regimen.  Therapeutic Interventions: 1 to 1 sessions, Unit Group sessions and Medication administration.  Evaluation of Outcomes: Not Met   RN Treatment Plan for Primary Diagnosis: <principal problem not specified> Long Term Goal(s): Knowledge of disease and therapeutic regimen to maintain health will improve  Short Term Goals: Ability to verbalize frustration and anger appropriately will improve, Ability to identify and develop effective coping behaviors will improve and Compliance with prescribed medications will improve  Medication Management: RN will administer medications as ordered by provider, will assess and evaluate patient's response and provide education to patient for prescribed medication. RN will report any adverse and/or side effects to prescribing provider.  Therapeutic Interventions: 1 on 1 counseling sessions, Psychoeducation, Medication administration, Evaluate  responses to treatment, Monitor vital signs and CBGs as ordered, Perform/monitor CIWA, COWS, AIMS and Fall Risk screenings as ordered, Perform wound care treatments as ordered.  Evaluation of Outcomes: Not Met   LCSW Treatment Plan for  Primary Diagnosis: <principal problem not specified> Long Term Goal(s): Safe transition to appropriate next level of care at discharge, Engage patient in therapeutic group addressing interpersonal concerns.  Short Term Goals: Engage patient in aftercare planning with referrals and resources, Increase social support, Identify triggers associated with mental health/substance abuse issues and Increase skills for wellness and recovery  Therapeutic Interventions: Assess for all discharge needs, 1 to 1 time with Social worker, Explore available resources and support systems, Assess for adequacy in community support network, Educate family and significant other(s) on suicide prevention, Complete Psychosocial Assessment, Interpersonal group therapy.  Evaluation of Outcomes: Not Met   Progress in Treatment: Attending groups: No. Participating in groups: No. Taking medication as prescribed: Yes. Toleration medication: Yes. Family/Significant other contact made: No, will contact:  if consents are given. Patient understands diagnosis: Yes. Discussing patient identified problems/goals with staff: No. Medical problems stabilized or resolved: Yes. Denies suicidal/homicidal ideation: Yes. Issues/concerns per patient self-inventory: No. Other: none  New problem(s) identified: No, Describe:  CSW will continue to assess  New Short Term/Long Term Goal(s):  medication stabilization, elimination of SI thoughts, development of comprehensive mental wellness plan.   Patient Goals:  Pt did not attend.   Discharge Plan or Barriers: Patient recently admitted. CSW will continue to follow and assess for appropriate referrals and possible discharge planning.   Reason for Continuation of Hospitalization: Delusions  Mania Medication stabilization  Estimated Length of Stay: 3-5 days   Attendees: Patient: Did not attend  06/19/2019   Physician: Myles Lipps, MD 06/19/2019   Nursing:  06/19/2019   RN Care Manager:  06/19/2019   Social Worker: Darletta Moll  06/19/2019  Recreational Therapist:  06/19/2019   Other:  06/19/2019   Other:  06/19/2019   Other: 06/19/2019     Scribe for Treatment Team: Vassie Moselle, [place] 06/19/2019 9:52 AM

## 2019-06-19 NOTE — Progress Notes (Signed)
Pt was found resting in her bed at the beginning of the shift. Pt expressed that she's a single mother and her goal is continue to rest and find a job once she's discharged so she can support her daughter. She said that she likes crafts and would like to pursue a job in that area. Pt appears sad, depressed, and worried. She said that family has been "nosey about my love life." "They take you here and there." She also says that she doesn't like when people think she's Muslim because her first name is Niomie, "I don't like when people infiltrate my religious beliefs, I'm Christian." Pt was later found sitting on the floor near the window in her room with her knees drawn up. Pt was tearful and depressed upon interaction. Pt said that she doesn't like how her family has been so "nosey" in her life. Active listening, reassurance, and support provided. Pt went into the dayroom to distract herself some by watching TV. Pt denies SI/HI and AVH. Medications administered as ordered by MD. Q 15 minute safety checks continue.     06/19/19 2000  Psych Admission Type (Psych Patients Only)  Admission Status Involuntary  Psychosocial Assessment  Patient Complaints Anxiety;Crying spells;Depression;Insomnia;Sadness;Worrying  Eye Contact Fair  Facial Expression Anxious;Sad;Worried  Affect Anxious;Depressed;Preoccupied;Sad  Speech Logical/coherent;Tangential;Soft  Interaction Assertive  Motor Activity Other (Comment) (WDL)  Appearance/Hygiene Unremarkable  Behavior Characteristics Cooperative;Anxious;Calm  Mood Depressed;Anxious;Sad;Preoccupied  Thought Ship broker  Content Preoccupation  Delusions None reported or observed  Perception WDL  Hallucination None reported or observed  Judgment Impaired  Confusion None  Danger to Self  Current suicidal ideation? Denies  Danger to Others  Danger to Others None reported or observed
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
## 2019-06-20 NOTE — Progress Notes (Signed)
   06/20/19 1100  Psych Admission Type (Psych Patients Only)  Admission Status Involuntary  Psychosocial Assessment  Patient Complaints Anxiety;Depression  Eye Contact Fair  Facial Expression Anxious  Affect Anxious;Depressed  Speech Logical/coherent  Interaction Assertive  Motor Activity Other (Comment) (WDL)  Appearance/Hygiene Unremarkable  Behavior Characteristics Cooperative  Mood Anxious;Depressed  Thought Process  Coherency WDL  Content WDL  Delusions Paranoid  Perception WDL  Hallucination None reported or observed  Judgment Impaired  Confusion None  Danger to Self  Current suicidal ideation? Denies  Danger to Others  Danger to Others None reported or observed   D:Pt rates depression as a 5 and anxiety as a 4 on 0-10 scale with 10 being the most. Pt reports she has a 36 year old child and was looking for housing and a job before coming to the hospital.  A:Offered support, encouragement and 15 minute checks. R:Pt denies si and hi. Safety maintained on the unit.

## 2019-06-20 NOTE — Progress Notes (Signed)
Pt continues to worry about her 27 year old daughter because she doesn't know where she's at or who she's with since being admitted to [facility]. Pt said "no one will tell me." Pt also said that she hasn't been able to talk to her daughter on the phone because of this. Pt asked about where she was residing before. 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 She is pleasant and calm upon interaction. Spends the majority of her time resting in bed. Active listening, reassurance, and support provided. Pt denies SI/HI and AVH. Medications administered as ordered by MD. No adverse effects noted. Q 15 min safety checks continue.    06/20/19 2000  Psych Admission Type (Psych Patients Only)  Admission Status Involuntary  Psychosocial Assessment  Patient Complaints Anxiety;Depression;Sadness;Worrying  Eye Contact Fair  Facial Expression Anxious;Sad  Affect Depressed;Sad  Speech Logical/coherent  Interaction Assertive  Motor Activity Other (Comment) (WDL)  Appearance/Hygiene Unremarkable  Behavior Characteristics Cooperative;Appropriate to situation;Calm  Mood Depressed;Sad  Thought Ship broker  Content WDL  Delusions Paranoid  Perception WDL  Hallucination None reported or observed  Judgment Impaired  Confusion None  Danger to Self  Current suicidal ideation? Denies  Danger to Others  Danger to Others Reported or observed

## 2019-06-20 NOTE — Progress Notes (Signed)
[place] [facility] MD Progress Note  06/20/2019 10:54 AM Laurie Hooper  MRN:  [id number]   Subjective: Follow-up for this 29 year old female diagnosed with bipolar 1 disorder.  Patient reports that she is feeling better today and states that she slept well and that she feels that her mood is good.  Patient starts discussing she came back to the hospital and has tangential speech and rambles about her parents marital problems, where she is stable, a rash that was related to her being in love, as well as not taking her medications as prescribed.  Patient's thought process is disorganized and has loose associations along with delusions reporting that she has had real voices telling her that she has friends on the Internet and to take vitamins instead of her medications.  Principal Problem: Bipolar 1 disorder (HCC) Diagnosis: Principal Problem:   Bipolar 1 disorder (HCC)  Total Time spent with patient: 20 minutes  Past Psychiatric History: Patient has had several psychiatric hospitalizations in the last year.  She has been admitted to the behavioral health hospital on 3 occasions, and one occasion at [facility].  There is also been a hospitalization at [facility] in December 2020.  She has been treated with several different medications.  Most recently she has been treated with Tegretol and Risperdal.  She has a diagnosis of bipolar disorder as well as posttraumatic stress disorder.  Past Medical History:  Past Medical History:  Diagnosis Date  . Anxiety   . Medical history non-contributory   . Psychosis [facility])     Past Surgical History:  Procedure Laterality Date  . KNEE ARTHROSCOPY W/ ACL RECONSTRUCTION     Family History:  Family History  Problem Relation Age of Onset  . Diabetes Paternal Grandmother   . Kidney disease Father   . Thyroid disease Mother   . Cancer Neg Hx    Family Psychiatric  History: Patient reported mental health problems in her  grandfather.  Unspecified illness. Social History:  Social History   Substance and Sexual Activity  Alcohol Use Not Currently     Social History   Substance and Sexual Activity  Drug Use No    Social History   Socioeconomic History  . Marital status: Legally Separated    Spouse name: Not on file  . Number of children: Not on file  . Years of education: Not on file  . Highest education level: Not on file  Occupational History  . Occupation: Unknown  Tobacco Use  . Smoking status: Former Smoker    Types: Cigarettes  . Smokeless tobacco: Never Used  Substance and Sexual Activity  . Alcohol use: Not Currently  . Drug use: No  . Sexual activity: Yes    Birth control/protection: None  Other Topics Concern  . Not on file  Social History Narrative   Pt lives in [place] -- would not say where or with whom.  Pt denied current outpatient psychiatric care.  Stated that she is separated from her husband.   Social Determinants of Health   Financial Resource Strain:   . Difficulty of Paying Living Expenses:   Food Insecurity:   . Worried About Programme researcher, broadcasting/film/video in the Last Year:   . Barista in the Last Year:   Transportation Needs:   . Freight forwarder (Medical):   Marland Kitchen Lack of Transportation (Non-Medical):   Physical Activity:   . Days of Exercise per Week:   . Minutes of  Exercise per Session:   Stress:   . Feeling of Stress :   Social Connections:   . Frequency of Communication with Friends and Family:   . Frequency of Social Gatherings with Friends and Family:   . Attends Religious Services:   . Active Member of Clubs or Organizations:   . Attends Archivist Meetings:   Marland Kitchen Marital Status:    Additional Social History:                         Sleep: Good  Appetite:  Fair  Current Medications: Current Facility-Administered Medications  Medication Dose Route Frequency Provider Last Rate Last Admin  . carbamazepine (TEGRETOL)  chewable tablet 200 mg  200 mg Oral BID Suella Broad, FNP   200 mg at 06/20/19 0838  . hydrOXYzine (ATARAX/VISTARIL) tablet 25 mg  25 mg Oral TID PRN Suella Broad, FNP   25 mg at 06/19/19 2114  . OLANZapine zydis (ZYPREXA) disintegrating tablet 10 mg  10 mg Oral Q8H PRN Sharma Covert, MD       And  . LORazepam (ATIVAN) tablet 1 mg  1 mg Oral PRN Sharma Covert, MD       And  . ziprasidone (GEODON) injection 20 mg  20 mg Intramuscular PRN Sharma Covert, MD      . LORazepam (ATIVAN) tablet 2 mg  2 mg Oral Q6H PRN Sharma Covert, MD      . magnesium hydroxide (MILK OF MAGNESIA) suspension 30 mL  30 mL Oral Daily PRN Suella Broad, FNP      . OLANZapine zydis (ZYPREXA) disintegrating tablet 10 mg  10 mg Oral QHS Sharma Covert, MD   10 mg at 06/19/19 2112  . OLANZapine zydis (ZYPREXA) disintegrating tablet 5 mg  5 mg Oral Daily Sharma Covert, MD   5 mg at 06/20/19 2952    Lab Results:  Results for orders placed or performed during the hospital encounter of 06/18/19 (from the past 48 hour(s))  Hemoglobin A1c     Status: Abnormal   Collection Time: 06/19/19  6:07 PM  Result Value Ref Range   Hgb A1c MFr Bld 6.0 (H) 4.8 - 5.6 %    Comment: (NOTE) Pre diabetes:          5.7%-6.4%  Diabetes:              >6.4%  Glycemic control for   <7.0% adults with diabetes    Mean Plasma Glucose 125.5 mg/dL    Comment: Performed at [facility], [place] [address], [place], [place] [address]  Lipid panel     Status: Abnormal   Collection Time: 06/19/19  6:07 PM  Result Value Ref Range   Cholesterol 145 0 - 200 mg/dL   Triglycerides 91 <150 mg/dL   HDL 33 (L) >40 mg/dL   Total CHOL/HDL Ratio 4.4 RATIO   VLDL 18 0 - 40 mg/dL   LDL Cholesterol 94 0 - 99 mg/dL    Comment:        Total Cholesterol/HDL:CHD Risk Coronary Heart Disease Risk Table                     Men   Women  1/2 Average Risk   3.4   3.3  Average Risk       5.0   4.4  2 X  Average Risk   9.6  7.1  3 X Average Risk  23.4   11.0        Use the calculated Patient Ratio above and the CHD Risk Table to determine the patient's CHD Risk.        ATP III CLASSIFICATION (LDL):  <100     mg/dL   Optimal  076-226  mg/dL   Near or Above                    Optimal  130-159  mg/dL   Borderline  333-545  mg/dL   High  >625     mg/dL   Very High Performed at [facility], [address], [place], [place] [address]   TSH     Status: None   Collection Time: 06/19/19  6:07 PM  Result Value Ref Range   TSH 0.537 0.350 - 4.500 uIU/mL    Comment: Performed by a 3rd Generation assay with a functional sensitivity of <=0.01 uIU/mL. Performed at [facility], [address] [place], [place] [address]     Blood Alcohol level:  Lab Results  Component Value Date   [facility] <10 06/17/2019   ETH <10 12/15/2018    Metabolic Disorder Labs: Lab Results  Component Value Date   HGBA1C 6.0 (H) 06/19/2019   MPG 125.5 06/19/2019   MPG 105.41 07/29/2018   Lab Results  Component Value Date   PROLACTIN 107.9 (H) 07/29/2018   Lab Results  Component Value Date   CHOL 145 06/19/2019   TRIG 91 06/19/2019   HDL 33 (L) 06/19/2019   CHOLHDL 4.4 06/19/2019   VLDL 18 06/19/2019   LDLCALC 94 06/19/2019   LDLCALC 104 (H) 12/16/2018    Physical Findings: AIMS:  , ,  ,  ,    CIWA:    COWS:     Musculoskeletal: Strength & Muscle Tone: within normal limits Gait & Station: normal Patient leans: N/A  Psychiatric Specialty Exam: Physical Exam  Nursing note and vitals reviewed. Constitutional: She is oriented to person, place, and time. She appears well-developed. She is cooperative.  Respiratory: Effort normal.  Musculoskeletal:        General: Normal range of motion.  Neurological: She is alert and oriented to person, place, and time.  Skin: Skin is warm.  Psychiatric: Her behavior is normal. Her speech is rapid and/or pressured and  tangential. Thought content is paranoid and delusional. She expresses impulsivity.    Review of Systems  Constitutional: Negative.   HENT: Negative.   Eyes: Negative.   Respiratory: Negative.   Cardiovascular: Negative.   Gastrointestinal: Negative.   Genitourinary: Negative.   Musculoskeletal: Negative.   Skin: Negative.   Neurological: Negative.     Blood pressure 106/76, pulse (!) 112, temperature 99.1 F (37.3 C), temperature source Oral, resp. rate 16, height 6\' 1"  (1.854 m), weight 83.5 kg, SpO2 100 %.Body mass index is 24.28 kg/m.  General Appearance: Disheveled  Eye Contact:  Good  Speech:  Pressured  Volume:  Normal  Mood:  Euphoric  Affect:  Congruent  Thought Process:  Disorganized, Irrelevant and Descriptions of Associations: Loose  Orientation:  Full (Time, Place, and Person)  Thought Content:  Delusions, Hallucinations: Auditory, Paranoid Ideation, Rumination and Tangential  Suicidal Thoughts:  No  Homicidal Thoughts:  No  Memory:  Immediate;   Poor Recent;   Poor Remote;   Poor  Judgement:  Impaired  Insight:  Lacking  Psychomotor Activity:  Increased  Concentration:  Concentration:  Poor  Recall:  [employer] of Knowledge:  Poor  Language:  Fair  Akathisia:  No  Handed:  Right  AIMS (if indicated):     Assets:  Desire for Improvement Physical Health Resilience Social Support  ADL's:  Intact  Cognition:  WNL  Sleep:  Number of Hours: 6.25   Assessment: Patient presents in the day room and is pleasant, calm, cooperative and reports to be euthymic.  Patient will deny any suicidal homicidal ideations and denies having hallucinations but during her conversation reports having voices to tell her to stop her medications as well as having friends on the Internet and [place] that tell her to take specific concentrated vitamins in place of her medications.  Patient speech is tangential and ruminates her thought process is disorganized.  Patient denied hallucinations  but again stated that she was hoping that vitamins would remove the voices from her head.  Reviewed patient's labs and her lipid panel was within normal limits except for HDL being at 33, A1c is at 6.0, and TSH was WNL.  Patient has been compliant with her medications and continues to be pleasant and nonthreatening on the unit.  Vital signs are WNL except for pulse being 112.  Treatment Plan Summary: Daily contact with patient to assess and evaluate symptoms and progress in treatment and Medication management Continue Tegretol 200 mg p.o. twice daily for mood stability Continue Vistaril 25 mg p.o. 3 times daily as needed for anxiety Continue Ativan 2 mg p.o. every 6 hours as needed for anxiety agitation Continue Zyprexa Zydis 10 mg p.o. nightly and 5 mg p.o. daily for bipolar 1 disorder and mood stability Continue agitation protocol of Zyprexa Zydis, Ativan, and Geodon Encourage group therapy participation Continue every 15 minute safety checks  Maryfrances Bunnell, FNP 06/20/2019, 10:54 AM

## 2019-06-20 NOTE — [facility] Group Notes (Signed)
  [facility] LCSW Group Therapy Note  Date/Time:  06/20/2019 11:15AM-12:00PM  Type of Therapy and Topic:  Group Therapy:  Feelings About Hospitalization  Participation Level:  Did Not Attend   Description of Group This process group involved patients discussing their feelings related to being hospitalized, as well as the benefits they see to being in the hospital.  These feelings and benefits were itemized.  The group then brainstormed specific ways in which they could seek those same benefits when they discharge and return home.  Therapeutic Goals 1. Patient will identify and describe positive and negative feelings related to hospitalization 2. Patient will verbalize benefits of hospitalization to themselves personally 3. Patients will brainstorm together ways they can obtain similar benefits in the outpatient setting, identify barriers to wellness and possible solutions  Summary of Patient Progress:  The patient was invited to group, did not attend.   Therapeutic Modalities Cognitive Behavioral Therapy Motivational Interviewing    Ambrose Mantle, LCSW 06/20/2019, 11:55 AM

## 2019-06-20 NOTE — Progress Notes (Signed)
   06/20/19 2000  COVID-19 Daily Checkoff  Have you had a fever (temp > 37.80C/100F)  in the past 24 hours?  No  COVID-19 EXPOSURE  Have you traveled outside the state in the past 14 days? No  Have you been in contact with someone with a confirmed diagnosis of COVID-19 or PUI in the past 14 days without wearing appropriate PPE? No  Have you been living in the same home as a person with confirmed diagnosis of COVID-19 or a PUI (household contact)? No  Have you been diagnosed with COVID-19? No

## 2019-06-20 NOTE — [facility] Group Notes (Signed)
Adult Psychoeducational Group Note  Date:  06/20/2019 Time:  10:51 AM  Group Topic/Focus:  Goals Group:   The focus of this group is to help patients establish daily goals to achieve during treatment and discuss how the patient can incorporate goal setting into their daily lives to aide in recovery.  Participation Level:  Did Not Attend   Dione Housekeeper 06/20/2019, 10:51 AM

## 2019-06-20 NOTE — H&P (Signed)
Psychiatric Admission Assessment Adult  Patient Identification: Laurie Hooper MRN:  [id number] Date of Evaluation:  06/20/2019 Chief Complaint:  Schizophrenia (HCC) [F20.9] Principal Diagnosis: Bipolar 1 disorder (HCC) Diagnosis:  Principal Problem:   Bipolar 1 disorder (HCC)  History of Present Illness: This note is from an evaluation on a patient on 06/19/2019.  Unfortunately was not dictated until 06/20/2019.    Patient is seen and examined.  Patient is a 29 year old female with a previous past psychiatric history for bipolar disorder and posttraumatic stress disorder.  She was brought to the [facility] emergency department on 06/17/2019 under involuntary commitment.  Her counselor at [facility] did the involuntary commitment paperwork.  In the emergency department the patient was noted to be having pressured speech, racing thoughts, and tangentiality.  She was also significantly paranoid.  She was transferred to our facility for continued care.  This a.m. on evaluation the patient continues to be paranoid, pressured and tangential.  She discusses several issues that include her family "infiltrating" her life.  When we discussed her most recent hospitalization at [facility] she stated that she had taken her medications after discharge for short period of time, but when they had to be refilled her stepfather/mother's boyfriend or husband picked up her medications, and he is "a drug dealer".  Because of this she was not sure whether or not the medicines were "real".  I actually discharged the patient from [facility] on 12/21/2018.  She was discharged on Risperdal 2 mg p.o. nightly and Tegretol 200 mg p.o. twice daily.  While we discussed her psychiatric follow-up today she again went on another tangent, but had not followed up at any time.  She was admitted to the hospital for evaluation and stabilization.  Associated Signs/Symptoms: Depression  Symptoms:  anhedonia, insomnia, difficulty concentrating, anxiety, disturbed sleep, (Hypo) Manic Symptoms:  Delusions, Distractibility, Elevated Mood, Hallucinations, Impulsivity, Irritable Mood, Labiality of Mood, Anxiety Symptoms:  Excessive Worry, Psychotic Symptoms:  Delusions, Hallucinations: Auditory Paranoia, PTSD Symptoms: Had a traumatic exposure:  In the past Total Time spent with patient: 30 minutes  Past Psychiatric History: Patient has had several psychiatric hospitalizations in the last year.  She has been admitted to the behavioral health hospital on 3 occasions, and one occasion at [facility].  There is also been a hospitalization at [facility] in December 2020.  She has been treated with several different medications.  Most recently she has been treated with Tegretol and Risperdal.  She has a diagnosis of bipolar disorder as well as posttraumatic stress disorder.  Is the patient at risk to self? Yes.    Has the patient been a risk to self in the past 6 months? Yes.    Has the patient been a risk to self within the distant past? No.  Is the patient a risk to others? No.  Has the patient been a risk to others in the past 6 months? No.  Has the patient been a risk to others within the distant past? No.   Prior Inpatient Therapy:   Prior Outpatient Therapy:    Alcohol Screening: 1. How often do you have a drink containing alcohol?: Never 2. How many drinks containing alcohol do you have on a typical day when you are drinking?: 1 or 2 3. How often do you have six or more drinks on one occasion?: Never AUDIT-C Score: 0 4. How often during the last year have you  found that you were not able to stop drinking once you had started?: Never 5. How often during the last year have you failed to do what was normally expected from you because of drinking?: Never 6. How often during the last year have you needed a first drink in the  morning to get yourself going after a heavy drinking session?: Never 7. How often during the last year have you had a feeling of guilt of remorse after drinking?: Never 8. How often during the last year have you been unable to remember what happened the night before because you had been drinking?: Never 9. Have you or someone else been injured as a result of your drinking?: No 10. Has a relative or friend or a doctor or another health worker been concerned about your drinking or suggested you cut down?: No Alcohol Use Disorder Identification Test Final Score (AUDIT): 0 Alcohol Brief Interventions/Follow-up: AUDIT Score <7 follow-up not indicated Substance Abuse History in the last 12 months:  No. Consequences of Substance Abuse: Negative Previous Psychotropic Medications: Yes  Psychological Evaluations: Yes  Past Medical History:  Past Medical History:  Diagnosis Date  . Anxiety   . Medical history non-contributory   . Psychosis [facility])     Past Surgical History:  Procedure Laterality Date  . KNEE ARTHROSCOPY W/ ACL RECONSTRUCTION     Family History:  Family History  Problem Relation Age of Onset  . Diabetes Paternal Grandmother   . Kidney disease Father   . Thyroid disease Mother   . Cancer Neg Hx    Family Psychiatric  History: Patient reported mental health problems in her grandfather.  Unspecified illness. Tobacco Screening: Have you used any form of tobacco in the last 30 days? (Cigarettes, Smokeless Tobacco, Cigars, and/or Pipes): Yes Tobacco use, Select all that apply: 4 or less cigarettes per day Are you interested in Tobacco Cessation Medications?: No, patient refused Counseled patient on smoking cessation including recognizing danger situations, developing coping skills and basic information about quitting provided: Refused/Declined practical counseling Social History:  Social History   Substance and Sexual Activity  Alcohol Use Not Currently     Social History    Substance and Sexual Activity  Drug Use No    Additional Social History: Marital status: Married                         Allergies:  No Known Allergies Lab Results:  Results for orders placed or performed during the hospital encounter of 06/18/19 (from the past 48 hour(s))  Hemoglobin A1c     Status: Abnormal   Collection Time: 06/19/19  6:07 PM  Result Value Ref Range   Hgb A1c MFr Bld 6.0 (H) 4.8 - 5.6 %    Comment: (NOTE) Pre diabetes:          5.7%-6.4%  Diabetes:              >6.4%  Glycemic control for   <7.0% adults with diabetes    Mean Plasma Glucose 125.5 mg/dL    Comment: Performed at [facility], [address], [place], [place] [address]  Lipid panel     Status: Abnormal   Collection Time: 06/19/19  6:07 PM  Result Value Ref Range   Cholesterol 145 0 - 200 mg/dL   Triglycerides 91 <258 mg/dL   HDL 33 (L) >52 mg/dL   Total CHOL/HDL Ratio 4.4 RATIO   VLDL 18 0 - 40 mg/dL  LDL Cholesterol 94 0 - 99 mg/dL    Comment:        Total Cholesterol/HDL:CHD Risk Coronary Heart Disease Risk Table                     Men   Women  1/2 Average Risk   3.4   3.3  Average Risk       5.0   4.4  2 X Average Risk   9.6   7.1  3 X Average Risk  23.4   11.0        Use the calculated Patient Ratio above and the CHD Risk Table to determine the patient's CHD Risk.        ATP III CLASSIFICATION (LDL):  <100     mg/dL   Optimal  [id number]  mg/dL   Near or Above                    Optimal  130-159  mg/dL   Borderline  [phone]  mg/dL   High  >956>190     mg/dL   Very High Performed at [facility], [address] [place], [place] [address]   TSH     Status: None   Collection Time: 06/19/19  6:07 PM  Result Value Ref Range   TSH 0.537 0.350 - 4.500 uIU/mL    Comment: Performed by a 3rd Generation assay with a functional sensitivity of <=0.01 uIU/mL. Performed at [facility], [address] [place], [place] [address]      Blood Alcohol level:  Lab Results  Component Value Date   [facility] 06/17/2019   ETH <10 12/15/2018    Metabolic Disorder Labs:  Lab Results  Component Value Date   HGBA1C 6.0 (H) 06/19/2019   MPG 125.5 06/19/2019   MPG 105.41 07/29/2018   Lab Results  Component Value Date   PROLACTIN 107.9 (H) 07/29/2018   Lab Results  Component Value Date   CHOL 145 06/19/2019   TRIG 91 06/19/2019   HDL 33 (L) 06/19/2019   CHOLHDL 4.4 06/19/2019   VLDL 18 06/19/2019   LDLCALC 94 06/19/2019   LDLCALC 104 (H) 12/16/2018    Current Medications: Current Facility-Administered Medications  Medication Dose Route Frequency Provider Last Rate Last Admin  . carbamazepine (TEGRETOL) chewable tablet 200 mg  200 mg Oral BID Maryagnes AmosStarkes-Perry, Takia S, FNP   200 mg at 06/19/19 1755  . hydrOXYzine (ATARAX/VISTARIL) tablet 25 mg  25 mg Oral TID PRN Maryagnes AmosStarkes-Perry, Takia S, FNP   25 mg at 06/19/19 2114  . OLANZapine zydis (ZYPREXA) disintegrating tablet 10 mg  10 mg Oral Q8H PRN Antonieta Pertlary, Marvyn Torrez Lawson, MD       And  . LORazepam (ATIVAN) tablet 1 mg  1 mg Oral PRN Antonieta Pertlary, Aasiyah Auerbach Lawson, MD       And  . ziprasidone (GEODON) injection 20 mg  20 mg Intramuscular PRN Antonieta Pertlary, Darlean Warmoth Lawson, MD      . LORazepam (ATIVAN) tablet 2 mg  2 mg Oral Q6H PRN Antonieta Pertlary, Brendaliz Kuk Lawson, MD      . magnesium hydroxide (MILK OF MAGNESIA) suspension 30 mL  30 mL Oral Daily PRN Maryagnes AmosStarkes-Perry, Takia S, FNP      . OLANZapine zydis (ZYPREXA) disintegrating tablet 10 mg  10 mg Oral QHS Antonieta Pertlary, Phillp Dolores Lawson, MD   10 mg at 06/19/19 2112  . OLANZapine zydis (ZYPREXA) disintegrating tablet 5 mg  5 mg Oral Daily Landry Mellowlary, Shuntel Fishburn  Kellie Simmering, MD   5 mg at 06/19/19 1610   PTA Medications: Medications Prior to Admission  Medication Sig Dispense Refill Last Dose  . carbamazepine (TEGRETOL) 100 MG chewable tablet Chew 2 tablets (200 mg total) by mouth 2 (two) times a day. (Patient not taking: Reported on 12/15/2018) 120 tablet 1   . risperiDONE (RISPERDAL) 2 MG  tablet Take 1 tablet (2 mg total) by mouth at bedtime. 30 tablet 0     Musculoskeletal: Strength & Muscle Tone: within normal limits Gait & Station: normal Patient leans: N/A  Psychiatric Specialty Exam: Physical Exam  Nursing note and vitals reviewed. Constitutional: She is oriented to person, place, and time.  HENT:  Head: Normocephalic and atraumatic.  Respiratory: Effort normal.  GI: Normal appearance.  Neurological: She is alert and oriented to person, place, and time.    Review of Systems  Blood pressure 106/76, pulse (!) 112, temperature 99.1 F (37.3 C), temperature source Oral, resp. rate 16, height 6\' 1"  (1.854 m), weight 83.5 kg, SpO2 100 %.Body mass index is 24.28 kg/m.  General Appearance: Disheveled  Eye Contact:  Good  Speech:  Pressured  Volume:  Increased  Mood:  Dysphoric and Euphoric  Affect:  Labile  Thought Process:  Disorganized and Descriptions of Associations: Loose  Orientation:  Full (Time, Place, and Person)  Thought Content:  Delusions, Hallucinations: Auditory and Paranoid Ideation  Suicidal Thoughts:  No  Homicidal Thoughts:  No  Memory:  Immediate;   Poor Recent;   Poor Remote;   Poor  Judgement:  Impaired  Insight:  Lacking  Psychomotor Activity:  Increased  Concentration:  Concentration: Poor and Attention Span: Poor  Recall:  Poor  Fund of Knowledge:  Fair  Language:  Good  Akathisia:  Negative  Handed:  Right  AIMS (if indicated):     Assets:  Desire for Improvement Resilience  ADL's:  Impaired  Cognition:  WNL  Sleep:  Number of Hours: 6.25    Treatment Plan Summary: Daily contact with patient to assess and evaluate symptoms and progress in treatment, Medication management and Plan : Patient is seen and examined.  Patient is a 29 year old female with the above-stated past psychiatric history who was admitted secondary to exacerbation of bipolar disorder.  Currently manic, currently psychotic.  She will be admitted to the  hospital.  She will be integrated in the milieu.  She will be encouraged to attend groups.  On admission Zyprexa 5 mg p.o. twice daily was written, as well as restarting the Tegretol 200 mg p.o. twice daily.  On admission her vital signs are stable, she is afebrile.  She had poor sleep last p.m.  Review of her admission laboratories revealed a mildly elevated glucose at 109.  Otherwise normal electrolytes including liver function enzymes.  CBC was normal.  Acetaminophen was less than 10, Tegretol was less than 2, salicylate was less than 7.  Pregnancy test was negative.  Blood alcohol was less than 10.  Drug screen was negative.  I am going to increase her Zyprexa to 5 mg p.o. daily and 10 mg p.o. nightly.  Hopefully this will get her mania under control.  Observation Level/Precautions:  15 minute checks  Laboratory:  Chemistry Profile  Psychotherapy:    Medications:    Consultations:    Discharge Concerns:    Estimated LOS:  Other:     Physician Treatment Plan for Primary Diagnosis: Bipolar 1 disorder ([place]) Long Term Goal(s): Improvement in symptoms so as ready for discharge  Short Term Goals: Ability to identify changes in lifestyle to reduce recurrence of condition will improve, Ability to verbalize feelings will improve, Ability to demonstrate self-control will improve, Ability to identify and develop effective coping behaviors will improve, Ability to maintain clinical measurements within normal limits will improve and Compliance with prescribed medications will improve  Physician Treatment Plan for Secondary Diagnosis: Principal Problem:   Bipolar 1 disorder (HCC)  Long Term Goal(s): Improvement in symptoms so as ready for discharge  Short Term Goals: Ability to identify changes in lifestyle to reduce recurrence of condition will improve, Ability to verbalize feelings will improve, Ability to demonstrate self-control will improve, Ability to identify and develop effective coping behaviors  will improve, Ability to maintain clinical measurements within normal limits will improve and Compliance with prescribed medications will improve  I certify that inpatient services furnished can reasonably be expected to improve the patient's condition.    Antonieta Pert, MD 6/12/20217:56 AM

## 2019-06-21 NOTE — [facility] Suicide Risk Assessment (Signed)
[facility] INPATIENT:  Family/Significant Other Suicide Prevention Education  Suicide Prevention Education:  Education Completed; Janeice Robinson, a family friend, has been identified by the patient as the family member/significant other with whom the patient will be residing, and identified as the person(s) who will aid the patient in the event of a mental health crisis (suicidal ideations/suicide attempt).  With written consent from the patient, the family member/significant other has been provided the following suicide prevention education, prior to the and/or following the discharge of the patient.  The suicide prevention education provided includes the following:  Suicide risk factors  Suicide prevention and interventions  National Suicide Hotline telephone number  [facility] assessment telephone number  [facility] Emergency Assistance 911  [facility] and/or Residential Mobile Crisis Unit telephone number  Request made of family/significant other to:  Remove weapons (e.g., guns, rifles, knives), all items previously/currently identified as safety concern.    Remove drugs/medications (over-the-counter, prescriptions, illicit drugs), all items previously/currently identified as a safety concern.  The family member/significant other verbalizes understanding of the suicide prevention education information provided.  The family member/significant other agrees to remove the items of safety concern listed above. Mr. Hart [place] expresses concern about the patient's limited social support in part to her burning bridges and alienating family members. Mr. Hart [place] expressed concern about patient's medication compliance and stated that ACTT would improve compliance. He is not sure if the patient has a place to go upon discharge and is fearful that she could end up homeless.  Evorn Gong 06/21/2019, 11:44 AM

## 2019-06-21 NOTE — Progress Notes (Signed)
   06/21/19 2017  COVID-19 Daily Checkoff  Have you had a fever (temp > 37.80C/100F)  in the past 24 hours?  No  COVID-19 EXPOSURE  Have you traveled outside the state in the past 14 days? No  Have you been in contact with someone with a confirmed diagnosis of COVID-19 or PUI in the past 14 days without wearing appropriate PPE? No  Have you been living in the same home as a person with confirmed diagnosis of COVID-19 or a PUI (household contact)? No  Have you been diagnosed with COVID-19? No

## 2019-06-21 NOTE — [facility] Group Notes (Signed)
Adult Psychoeducational Group Note  Date:  06/21/2019 Time:  9:45 AM  Group Topic/Focus:  Self Care:   The focus of this group is to help patients understand the importance of self-care in order to improve or restore emotional, physical, spiritual, interpersonal, and financial health.  Participation Level:  Active  Participation Quality:  Appropriate  Affect:  Appropriate  Cognitive:  Appropriate  Insight: Appropriate  Engagement in Group:  Engaged  Modes of Intervention:  Education  Additiona  Octavio Manns 06/21/2019, 9:45 AM
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
## 2019-06-21 NOTE — [facility] Group Notes (Signed)
[facility] LCSW Group Therapy Note  Date/Time:  06/21/2019  11:00AM-12:00PM  Type of Therapy and Topic:  Group Therapy:  Music and Mood  Participation Level:  Minimal   Description of Group: In this process group, members listened to a variety of genres of music and identified that different types of music evoke different responses.  Patients were encouraged to identify music that was soothing for them and music that was energizing for them.  Patients discussed how this knowledge can help with wellness and recovery in various ways including managing depression and anxiety as well as encouraging healthy sleep habits.    Therapeutic Goals: Patients will explore the impact of different varieties of music on mood Patients will verbalize the thoughts they have when listening to different types of music Patients will identify music that is soothing to them as well as music that is energizing to them Patients will discuss how to use this knowledge to assist in maintaining wellness and recovery Patients will explore the use of music as a coping skill  Summary of Patient Progress:  At the beginning of group, patient expressed that she felt "a little dizzy, a little woozy."  At the end of group, patient expressed that she felt good and was taking things "one step at a time."  She did not get up and dance like the rest of the group did, but did listen attentively.    Therapeutic Modalities: Solution Focused Brief Therapy Activity   Ambrose Mantle, LCSW

## 2019-06-21 NOTE — [facility] Group Notes (Signed)
Adult Psychoeducational Group Note  Date:  06/21/2019  Time:  9:00am-9:45am  Group Topic/Focus: PROGRESSIVE RELAXATION. A group where deep breathing is taught and tensing and relaxation muscle groups is used. Imagery is used as well.  Pts are asked to imagine 3 pillars that hold them up when they are not able to hold themselves up.  Participation Level:  Active  Participation Quality:  Appropriate  Affect:  Blunted  Cognitive:  Lacking  Insight: Lacking  Engagement in Group:  Engaged  Modes of Intervention:  Activity, Discussion, Education, and Support  Additional Comments:  Pt rates her energy at a 7/10. Her 3 pillars are reading, vacations and the boosters from college. She has learned, when you are a baby, you are a baby. When you are an adult, you must behave like one.  [name] A 12:48 PM

## 2019-06-21 NOTE — Progress Notes (Signed)
   06/21/19 0800  Psych Admission Type (Psych Patients Only)  Admission Status Involuntary  Psychosocial Assessment  Patient Complaints Depression  Eye Contact Fair  Facial Expression Anxious;Sad  Affect Depressed;Sad  Speech Logical/coherent  Interaction Assertive  Motor Activity Other (Comment) (WDL)  Appearance/Hygiene Unremarkable  Behavior Characteristics Cooperative  Mood Depressed;Anxious  Thought Process  Coherency WDL  Content WDL  Delusions None reported or observed;Paranoid  Perception WDL  Hallucination None reported or observed  Judgment Impaired  Confusion None  Danger to Self  Current suicidal ideation? Denies  Danger to Others  Danger to Others None reported or observed  Danger to Others Abnormal  Harmful Behavior to others No threats or harm toward other people

## 2019-06-22 MED ORDER — OLANZAPINE 10 MG PO TBDP: 10.0000 mg | ORAL_TABLET | Freq: Every day | ORAL | 0 refills | Status: AC

## 2019-06-22 MED ORDER — OLANZAPINE 5 MG PO TBDP: 5.0000 mg | ORAL_TABLET | Freq: Every day | ORAL | 0 refills | Status: AC

## 2019-06-22 MED ORDER — CARBAMAZEPINE 100 MG PO CHEW: 200.0000 mg | CHEWABLE_TABLET | Freq: Two times a day (BID) | ORAL | 0 refills | Status: AC

## 2019-06-22 NOTE — [facility] Counselor (Signed)
CSW left a voicemail for pt's friend Janeice Robinson [phone]).   Ruthann Cancer MSW, [employer] Clincal Social Worker  [facility]

## 2019-06-22 NOTE — [facility] Counselor (Signed)
CSW left a detailed voicemail for [facility] clinician Jethro Bastos [phone]). Per chart review, patient was expected to begin receiving ACTT services the day of admission.   Enid Cutter, MSW, LCSW-A Clinical Social Worker [facility]
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
## 2019-06-22 NOTE — Discharge Summary (Signed)
Physician Discharge Summary Note  Patient:  Laurie Hooper is an 29 y.o., female MRN:  [id number] DOB:  08-24-1990 Patient phone:  [phone] (home)  Patient address:   [place] [address],  Total Time spent with patient: 15 minutes  Date of Admission:  06/18/2019 Date of Discharge: 06/22/2019  Reason for Admission: mania with psychosis  Principal Problem: Bipolar 1 disorder [facility]) Discharge Diagnoses: Principal Problem:   Bipolar 1 disorder [facility])   Past Psychiatric History:  Patient has had several psychiatric hospitalizations in the last year. She has been admitted to the behavioral health hospital on 3 occasions, and one occasion at [facility].  There is also been a hospitalization at [facility] in December 2020.  She has been treated with several different medications. Most recently she has been treated with Tegretol and Risperdal.  She has a diagnosis of bipolar disorder as well as posttraumatic stress disorder.  Past Medical History:  Past Medical History:  Diagnosis Date  . Anxiety   . Medical history non-contributory   . Psychosis [facility])     Past Surgical History:  Procedure Laterality Date  . KNEE ARTHROSCOPY W/ ACL RECONSTRUCTION     Family History:  Family History  Problem Relation Age of Onset  . Diabetes Paternal Grandmother   . Kidney disease Father   . Thyroid disease Mother   . Cancer Neg Hx    Family Psychiatric  History: Patient reported mental health problems in her grandfather.  Unspecified illness. Social History:  Social History   Substance and Sexual Activity  Alcohol Use Not Currently     Social History   Substance and Sexual Activity  Drug Use No    Social History   Socioeconomic History  . Marital status: Legally Separated    Spouse name: Not on file  . Number of children: Not on file  . Years of education: Not on file  . Highest education level: Not on file   Occupational History  . Occupation: Unknown  Tobacco Use  . Smoking status: Former Smoker    Types: Cigarettes  . Smokeless tobacco: Never Used  Substance and Sexual Activity  . Alcohol use: Not Currently  . Drug use: No  . Sexual activity: Yes    Birth control/protection: None  Other Topics Concern  . Not on file  Social History Narrative   Pt lives in [place] -- would not say where or with whom.  Pt denied current outpatient psychiatric care.  Stated that she is separated from her husband.   Social Determinants of Health   Financial Resource Strain:   . Difficulty of Paying Living Expenses:   Food Insecurity:   . Worried About Charity fundraiser in the Last Year:   . Arboriculturist in the Last Year:   Transportation Needs:   . Film/video editor (Medical):   Marland Kitchen Lack of Transportation (Non-Medical):   Physical Activity:   . Days of Exercise per Week:   . Minutes of Exercise per Session:   Stress:   . Feeling of Stress :   Social Connections:   . Frequency of Communication with Friends and Family:   . Frequency of Social Gatherings with Friends and Family:   . Attends Religious Services:   . Active Member of Clubs or Organizations:   . Attends Archivist Meetings:   Marland Kitchen Marital Status:     Hospital Course:  From admission H&P: Patient  is a 29 year old female with a previous past psychiatric history for bipolar disorder and posttraumatic stress disorder. She was brought to the [facility] emergency department on 06/17/2019 under involuntary commitment. Her counselor at [facility] did the involuntary commitment paperwork. In the emergency department the patient was noted to be having pressured speech, racing thoughts, and tangentiality. She was also significantly paranoid. She was transferred to our facility for continued care. This a.m. on evaluation the patient continues to be paranoid, pressured and tangential. She discusses several issues  that include her family "infiltrating" her life. When we discussed her most recent hospitalization at [facility] she stated that she had taken her medications after discharge for short period of time, but when they had to be refilled her stepfather/mother's boyfriend or husband picked up her medications, and he is "a drug dealer". Because of this she was not sure whether or not the medicines were "real". I actually discharged the patient from [facility] on 12/21/2018. She was discharged on Risperdal 2 mg p.o. nightly and Tegretol 200 mg p.o. twice daily. While we discussed her psychiatric follow-up today she again went on another tangent, but had not followed up at any time. She was admitted to the hospital for evaluation and stabilization.  Ms. Mallozzi was admitted for mania with psychotic symptoms. She remained on the [facility] unit for four days. Tegretol was restarted, and Zyprexa was started. She participated in group therapy on the unit. She responded well to treatment with no adverse effects reported. She has shown improved mood, affect, sleep, and interaction. She denies any SI/HI/AVH and contracts for safety. No visible signs of paranoia. She is discharging on the medications listed below. She agrees to follow up at [facility] and [employer] (see below). Patient is provided with prescriptions and medication samples upon discharge. She is discharging to her father's home via [employer].   Physical Findings: AIMS: Facial and Oral Movements Muscles of Facial Expression: None, normal Lips and Perioral Area: None, normal Jaw: None, normal Tongue: None, normal,Extremity Movements Upper (arms, wrists, hands, fingers): None, normal Lower (legs, knees, ankles, toes): None, normal, Trunk Movements Neck, shoulders, hips: None, normal, Overall Severity Severity of abnormal movements (highest score from questions above): None, normal Incapacitation due  to abnormal movements: None, normal Patient's awareness of abnormal movements (rate only patient's report): No Awareness, Dental Status Current problems with teeth and/or dentures?: No Does patient usually wear dentures?: No  CIWA:    COWS:     Musculoskeletal: Strength & Muscle Tone: within normal limits Gait & Station: normal Patient leans: N/A  Psychiatric Specialty Exam: Physical Exam  Nursing note and vitals reviewed. Constitutional: She is oriented to person, place, and time. She appears well-developed.  Cardiovascular: Normal rate.  Respiratory: Effort normal.  Neurological: She is alert and oriented to person, place, and time.    Review of Systems  Constitutional: Negative.   Respiratory: Negative for cough and shortness of breath.   Psychiatric/Behavioral: Negative for agitation, behavioral problems, confusion, dysphoric mood, hallucinations, self-injury, sleep disturbance and suicidal ideas. The patient is not nervous/anxious and is not hyperactive.     Blood pressure 103/67, pulse (!) 113, temperature 98.7 F (37.1 C), temperature source Oral, resp. rate 16, height 6\' 1"  (1.854 m), weight 83.5 kg, SpO2 100 %.Body mass index is 24.28 kg/m.  See MD's discharge SRA    Have you used any form of tobacco in the last 30 days? (Cigarettes, Smokeless Tobacco, Cigars, and/or Pipes):  Yes  Has this patient used any form of tobacco in the last 30 days? (Cigarettes, Smokeless Tobacco, Cigars, and/or Pipes)  No  Blood Alcohol level:  Lab Results  Component Value Date   ETH <10 06/17/2019   ETH <10 12/15/2018    Metabolic Disorder Labs:  Lab Results  Component Value Date   HGBA1C 6.0 (H) 06/19/2019   MPG 125.5 06/19/2019   MPG 105.41 07/29/2018   Lab Results  Component Value Date   PROLACTIN 107.9 (H) 07/29/2018   Lab Results  Component Value Date   CHOL 145 06/19/2019   TRIG 91 06/19/2019   HDL 33 (L) 06/19/2019   CHOLHDL 4.4 06/19/2019   VLDL 18 06/19/2019    LDLCALC 94 06/19/2019   LDLCALC 104 (H) 12/16/2018    See Psychiatric Specialty Exam and Suicide Risk Assessment completed by Attending Physician prior to discharge.  Discharge destination:  Home  Is patient on multiple antipsychotic therapies at discharge:  No   Has Patient had three or more failed trials of antipsychotic monotherapy by history:  No  Recommended Plan for Multiple Antipsychotic Therapies: NA  Discharge Instructions    Discharge instructions   Complete by: As directed    Patient is instructed to take all prescribed medications as recommended. Report any side effects or adverse reactions to your outpatient psychiatrist. Patient is instructed to abstain from alcohol and illegal drugs while on prescription medications. In the event of worsening symptoms, patient is instructed to call the crisis hotline, 911, or go to the nearest emergency department for evaluation and treatment.     Allergies as of 06/22/2019   No Known Allergies     Medication List    STOP taking these medications   acetaminophen 325 MG tablet Commonly known as: TYLENOL   melatonin 5 MG Tabs   multivitamin with minerals Tabs tablet   naphazoline-glycerin 0.012-0.2 % Soln Commonly known as: CLEAR EYES REDNESS   omega-3 acid ethyl esters 1 g capsule Commonly known as: LOVAZA   risperiDONE 2 MG tablet Commonly known as: RISPERDAL   vitamin B-12 500 MCG tablet Commonly known as: CYANOCOBALAMIN     TAKE these medications     Indication  carbamazepine 100 MG chewable tablet Commonly known as: TEGRETOL Chew 2 tablets (200 mg total) by mouth 2 (two) times daily. What changed: when to take this  Indication: Manic-Depression   OLANZapine zydis 10 MG disintegrating tablet Commonly known as: ZYPREXA Take 1 tablet (10 mg total) by mouth at bedtime.  Indication: MIXED BIPOLAR AFFECTIVE DISORDER   OLANZapine zydis 5 MG disintegrating tablet Commonly known as: ZYPREXA Take 1 tablet (5 mg  total) by mouth daily.  Indication: MIXED BIPOLAR AFFECTIVE DISORDER       Follow-up Information    Services, [facility]. Go on 06/25/2019.   Why: Your hospital discharge appointment is scheduled for Thursday, 06/17 at 9:00am. Please bring your hospital discharge paperwork, photo ID, and proof of income or insurance.  Contact information: [address] [place] [address] [phone]        Inc, [address] [facility] Follow up.   Why: Please follow up with your referral for ACTT services Contact information: [address] [place] [address] [phone]               Follow-up recommendations: Activity as tolerated. Diet as recommended by primary care physician. Keep all scheduled follow-up appointments as recommended.   Comments:   Patient is instructed  to take all prescribed medications as recommended. Report any side effects or adverse reactions to your outpatient psychiatrist. Patient is instructed to abstain from alcohol and illegal drugs while on prescription medications. In the event of worsening symptoms, patient is instructed to call the crisis hotline, 911, or go to the nearest emergency department for evaluation and treatment.  Signed: Aldean Baker, NP 06/23/2019, 7:56 AM

## 2019-06-22 NOTE — [facility]HH Suicide Risk Assessment (Signed)
[facility] Discharge Suicide Risk Assessment   Principal Problem: Bipolar 1 disorder [facility]) Discharge Diagnoses: Principal Problem:   Bipolar 1 disorder (HCC)   Total Time spent with patient: 30 minutes  Musculoskeletal: Strength & Muscle Tone: within normal limits Gait & Station: normal Patient leans: N/A  Psychiatric Specialty Exam: Review of Systems  All other systems reviewed and are negative.   Blood pressure 103/67, pulse (!) 113, temperature 98.7 F (37.1 C), temperature source Oral, resp. rate 16, height 6\' 1"  (1.854 m), weight 83.5 kg, SpO2 100 %.Body mass index is 24.28 kg/m.  General Appearance: Casual  Eye Contact::  Good  Speech:  Normal Rate409  Volume:  Normal  Mood:  Euthymic  Affect:  Congruent  Thought Process:  Coherent and Descriptions of Associations: Circumstantial  Orientation:  Full (Time, Place, and Person)  Thought Content:  Logical  Suicidal Thoughts:  No  Homicidal Thoughts:  No  Memory:  Immediate;   Fair Recent;   Fair Remote;   Fair  Judgement:  Intact  Insight:  Fair  Psychomotor Activity:  Normal  Concentration:  Fair  Recall:  [phone] of Knowledge:Good  Language: Good  Akathisia:  Negative  Handed:  Right  AIMS (if indicated):     Assets:  Desire for Improvement Resilience  Sleep:  Number of Hours: 6.75  Cognition: WNL  ADL's:  Intact   Mental Status Per Nursing Assessment::   On Admission:  NA  Demographic Factors:  Low socioeconomic status, Living alone and Unemployed  Loss Factors: Financial problems/change in socioeconomic status  Historical Factors: Impulsivity  Risk Reduction Factors:   Positive coping skills or problem solving skills  Continued Clinical Symptoms:  Bipolar Disorder:   Mixed State Alcohol/Substance Abuse/Dependencies  Cognitive Features That Contribute To Risk:  Closed-mindedness    Suicide Risk:  Minimal: No identifiable suicidal ideation.  Patients presenting with no risk factors but with  morbid ruminations; may be classified as minimal risk based on the severity of the depressive symptoms   Follow-up Information    [facility], [facility]. Go on 06/25/2019.   Why: Your hospital discharge appointment is scheduled for Thursday, 06/17 at 9:00am. Please bring your hospital discharge paperwork, photo ID, and proof of income or insurance.  Contact information: [address] [place] [phone]        Inc, [phone] [facility] Follow up.   Why: Please follow up with your referral for ACTT services Contact information: [address] [place] [phone]               Plan Of Care/Follow-up recommendations:  Activity:  ad lib Tests:  You need to have your regretol level, liver function enzymes and complete blood court checked at you psychiatric follow up appointment  [phone], MD 06/22/2019, 2:39 PM

## 2019-06-22 NOTE — Progress Notes (Signed)
  [facility] Adult Case Management Discharge Plan :  Will you be returning to the same living situation after discharge:  No. Will returning to fathers home.  At discharge, do you have transportation home?: No. Safe Transport will be arranged. Do you have the ability to pay for your medications: No. Samples will be provided at discharge.  Release of information consent forms completed and in the chart;  Patient's signature needed at discharge.  Patient to Follow up at:  Follow-up Information    [facility], [facility]. Go on 06/25/2019.   Why: Your hospital discharge appointment is scheduled for Thursday, 06/17 at 9:00am. Please bring your hospital discharge paperwork, photo ID, and proof of income or insurance.  Contact information: [address] [place] [address] [phone]        Inc, [address] [facility] Follow up.   Why: Please follow up with your referral for ACTT services Contact information: [address] [place] [address] [phone]               Next level of care provider has access to [facility] Link:no  Safety Planning and Suicide Prevention discussed: Yes,  with friends  Have you used any form of tobacco in the last 30 days? (Cigarettes, Smokeless Tobacco, Cigars, and/or Pipes): Yes  Has patient been referred to the Quitline?: N/A patient is not a smoker  Patient has been referred for addiction treatment: N/A  Otelia Santee, LCSWA 06/22/2019, 3:13 PM

## 2019-06-22 NOTE — Progress Notes (Signed)
Pt discharged to lobby. Pt was stable and appreciative at that time. All papers, samples and prescriptions were given and valuables returned. Verbal understanding expressed. Denies SI/HI and A/VH. Pt given opportunity to express concerns and ask questions.  

## 2019-09-11 ENCOUNTER — Encounter: Payer: Self-pay | Admitting: Family Medicine

## 2023-12-19 ENCOUNTER — Ambulatory Visit: Payer: Self-pay | Admitting: Nurse Practitioner
